# Patient Record
Sex: Female | Born: 1955 | Race: White | Hispanic: No | Marital: Married | Smoking: Never smoker
Health system: Southern US, Community
[De-identification: ages and names within clinical notes are randomized; demographics above are authoritative.]

## PROBLEM LIST (undated history)

## (undated) DIAGNOSIS — M1990 Unspecified osteoarthritis, unspecified site: Secondary | ICD-10-CM

## (undated) DIAGNOSIS — F329 Major depressive disorder, single episode, unspecified: Secondary | ICD-10-CM

## (undated) DIAGNOSIS — F411 Generalized anxiety disorder: Secondary | ICD-10-CM

## (undated) DIAGNOSIS — A6000 Herpesviral infection of urogenital system, unspecified: Secondary | ICD-10-CM

## (undated) DIAGNOSIS — F32A Depression, unspecified: Secondary | ICD-10-CM

## (undated) DIAGNOSIS — J302 Other seasonal allergic rhinitis: Secondary | ICD-10-CM

## (undated) HISTORY — DX: Other seasonal allergic rhinitis: J30.2

## (undated) HISTORY — PX: VAGINAL HYSTERECTOMY: SUR661

## (undated) HISTORY — DX: Unspecified osteoarthritis, unspecified site: M19.90

## (undated) HISTORY — DX: Herpesviral infection of urogenital system, unspecified: A60.00

## (undated) HISTORY — DX: Generalized anxiety disorder: F41.1

## (undated) HISTORY — PX: CERVICAL FUSION: SHX112

---

## 1998-01-11 ENCOUNTER — Other Ambulatory Visit: Admission: RE | Admit: 1998-01-11 | Discharge: 1998-01-11 | Payer: Self-pay | Admitting: Obstetrics and Gynecology

## 2003-05-22 ENCOUNTER — Other Ambulatory Visit: Admission: RE | Admit: 2003-05-22 | Discharge: 2003-05-22 | Payer: Self-pay | Admitting: Gynecology

## 2003-07-19 ENCOUNTER — Ambulatory Visit (HOSPITAL_BASED_OUTPATIENT_CLINIC_OR_DEPARTMENT_OTHER): Admission: RE | Admit: 2003-07-19 | Discharge: 2003-07-19 | Payer: Self-pay | Admitting: Gynecology

## 2004-01-15 ENCOUNTER — Ambulatory Visit (HOSPITAL_COMMUNITY): Admission: RE | Admit: 2004-01-15 | Discharge: 2004-01-15 | Payer: Self-pay | Admitting: Gynecology

## 2005-01-02 ENCOUNTER — Other Ambulatory Visit: Admission: RE | Admit: 2005-01-02 | Discharge: 2005-01-02 | Payer: Self-pay | Admitting: Gynecology

## 2005-04-03 ENCOUNTER — Observation Stay (HOSPITAL_COMMUNITY): Admission: RE | Admit: 2005-04-03 | Discharge: 2005-04-04 | Payer: Self-pay | Admitting: Gynecology

## 2005-04-03 ENCOUNTER — Encounter (INDEPENDENT_AMBULATORY_CARE_PROVIDER_SITE_OTHER): Payer: Self-pay | Admitting: Specialist

## 2006-05-15 ENCOUNTER — Ambulatory Visit: Payer: Self-pay | Admitting: Internal Medicine

## 2006-12-17 ENCOUNTER — Other Ambulatory Visit: Admission: RE | Admit: 2006-12-17 | Discharge: 2006-12-17 | Payer: Self-pay | Admitting: Gynecology

## 2007-06-03 ENCOUNTER — Emergency Department: Payer: Self-pay | Admitting: Emergency Medicine

## 2008-05-01 ENCOUNTER — Encounter: Payer: Self-pay | Admitting: Women's Health

## 2008-05-01 ENCOUNTER — Other Ambulatory Visit: Admission: RE | Admit: 2008-05-01 | Discharge: 2008-05-01 | Payer: Self-pay | Admitting: Gynecology

## 2008-05-01 ENCOUNTER — Ambulatory Visit: Payer: Self-pay | Admitting: Women's Health

## 2008-12-05 ENCOUNTER — Ambulatory Visit: Payer: Self-pay | Admitting: Gynecology

## 2009-05-28 ENCOUNTER — Observation Stay (HOSPITAL_COMMUNITY): Admission: EM | Admit: 2009-05-28 | Discharge: 2009-05-29 | Payer: Self-pay | Admitting: Emergency Medicine

## 2009-05-29 ENCOUNTER — Ambulatory Visit: Payer: Self-pay | Admitting: Surgery

## 2009-05-29 ENCOUNTER — Encounter (INDEPENDENT_AMBULATORY_CARE_PROVIDER_SITE_OTHER): Payer: Self-pay | Admitting: Internal Medicine

## 2010-07-21 ENCOUNTER — Encounter: Payer: Self-pay | Admitting: Gastroenterology

## 2010-09-25 ENCOUNTER — Encounter: Payer: Self-pay | Admitting: Women's Health

## 2010-10-02 LAB — COMPREHENSIVE METABOLIC PANEL
ALT: 47 U/L — ABNORMAL HIGH (ref 0–35)
AST: 36 U/L (ref 0–37)
Albumin: 4.3 g/dL (ref 3.5–5.2)
Alkaline Phosphatase: 76 U/L (ref 39–117)
BUN: 15 mg/dL (ref 6–23)
CO2: 25 mEq/L (ref 19–32)
Calcium: 9 mg/dL (ref 8.4–10.5)
Chloride: 104 mEq/L (ref 96–112)
Creatinine, Ser: 0.68 mg/dL (ref 0.4–1.2)
GFR calc Af Amer: >60 mL/min (ref >60)
GFR calc non Af Amer: >60 mL/min (ref >60)
Glucose, Bld: 92 mg/dL (ref 70–99)
Potassium: 3.5 mEq/L (ref 3.5–5.1)
Sodium: 138 mEq/L (ref 135–145)
Total Bilirubin: 0.7 mg/dL (ref 0.3–1.2)
Total Protein: 7.5 g/dL (ref 6.0–8.3)

## 2010-10-02 LAB — LIPID PANEL
HDL: 73 mg/dL (ref >39)
LDL Cholesterol: 97 mg/dL (ref 0–99)
Total CHOL/HDL Ratio: 2.7 RATIO
Triglycerides: 141 mg/dL (ref <150)

## 2010-10-02 LAB — BASIC METABOLIC PANEL
BUN: 12 mg/dL (ref 6–23)
CO2: 29 mEq/L (ref 19–32)
Chloride: 107 mEq/L (ref 96–112)
Creatinine, Ser: 0.89 mg/dL (ref 0.4–1.2)
GFR calc Af Amer: >60 mL/min (ref >60)
Glucose, Bld: 91 mg/dL (ref 70–99)

## 2010-10-02 LAB — DIFFERENTIAL
Basophils Relative: 0 % (ref 0–1)
Eosinophils Absolute: 0 10*3/uL (ref 0.0–0.7)
Lymphocytes Relative: 27 % (ref 12–46)
Lymphs Abs: 2.1 10*3/uL (ref 0.7–4.0)
Monocytes Absolute: 0.5 10*3/uL (ref 0.1–1.0)
Neutro Abs: 5.3 10*3/uL (ref 1.7–7.7)

## 2010-10-02 LAB — CBC
HCT: 41.1 % (ref 36.0–46.0)
Hemoglobin: 13.9 g/dL (ref 12.0–15.0)
MCHC: 33.9 g/dL (ref 30.0–36.0)
MCHC: 34.1 g/dL (ref 30.0–36.0)
MCV: 94.8 fL (ref 78.0–100.0)
Platelets: 269 10*3/uL (ref 150–400)
RBC: 3.9 MIL/uL (ref 3.87–5.11)
RBC: 4.34 MIL/uL (ref 3.87–5.11)
RDW: 12.7 % (ref 11.5–15.5)
RDW: 12.9 % (ref 11.5–15.5)
WBC: 8 10*3/uL (ref 4.0–10.5)

## 2010-10-02 LAB — PROTIME-INR
INR: 0.94 (ref 0.00–1.49)
Prothrombin Time: 12.5 seconds (ref 11.6–15.2)

## 2010-10-02 LAB — CARDIAC PANEL(CRET KIN+CKTOT+MB+TROPI)
CK, MB: 1.6 ng/mL (ref 0.3–4.0)
CK, MB: 1.7 ng/mL (ref 0.3–4.0)
Relative Index: INVALID (ref 0.0–2.5)
Relative Index: INVALID (ref 0.0–2.5)
Total CK: 58 U/L (ref 7–177)
Total CK: 66 U/L (ref 7–177)
Troponin I: 0.02 ng/mL (ref 0.00–0.06)
Troponin I: 0.02 ng/mL (ref 0.00–0.06)

## 2010-10-02 LAB — POCT CARDIAC MARKERS: Troponin i, poc: <0.05 ng/mL (ref 0.00–0.09)

## 2010-10-11 ENCOUNTER — Other Ambulatory Visit: Payer: Self-pay | Admitting: Women's Health

## 2010-10-11 ENCOUNTER — Encounter (INDEPENDENT_AMBULATORY_CARE_PROVIDER_SITE_OTHER): Payer: BLUE CROSS/BLUE SHIELD | Admitting: Women's Health

## 2010-10-11 ENCOUNTER — Other Ambulatory Visit (HOSPITAL_COMMUNITY)
Admission: RE | Admit: 2010-10-11 | Discharge: 2010-10-11 | Disposition: A | Discharge status: Home / Self Care | Payer: BLUE CROSS/BLUE SHIELD | Source: Ambulatory Visit | Attending: Gynecology | Admitting: Gynecology

## 2010-10-11 DIAGNOSIS — Z124 Encounter for screening for malignant neoplasm of cervix: Secondary | ICD-10-CM | POA: Insufficient documentation

## 2010-10-11 DIAGNOSIS — Z13220 Encounter for screening for lipoid disorders: Secondary | ICD-10-CM

## 2010-10-11 DIAGNOSIS — Z01419 Encounter for gynecological examination (general) (routine) without abnormal findings: Secondary | ICD-10-CM

## 2010-10-11 DIAGNOSIS — Z833 Family history of diabetes mellitus: Secondary | ICD-10-CM

## 2010-10-11 DIAGNOSIS — E079 Disorder of thyroid, unspecified: Secondary | ICD-10-CM

## 2010-10-11 DIAGNOSIS — R82998 Other abnormal findings in urine: Secondary | ICD-10-CM

## 2010-11-15 NOTE — Op Note (Signed)
Christina [name], Christina [name]                         ACCOUNT NO.:  [id number]   MEDICAL RECORD NO.:  [id number]                   PATIENT TYPE:  AMB   LOCATION:  [facility]                                 FACILITY:  [facility]   PHYSICIAN:  Timothy P. Fontaine, M.D.           DATE OF BIRTH:  09-25-1955   DATE OF PROCEDURE:  07/19/2003  DATE OF DISCHARGE:                                 OPERATIVE REPORT   PREOPERATIVE DIAGNOSIS:  Menorrhagia.   POSTOPERATIVE DIAGNOSIS:  Menorrhagia.   PROCEDURE:  HTA, endometrial ablation.   SURGEON:  Timothy P. Fontaine, M.D.   ANESTHESIA:  General.   ESTIMATED BLOOD LOSS:  Minimal.   COMPLICATIONS:  None.   SPECIMENS:  None.   FINDINGS:  Endometrial cavity normal; mild arcuate appearance.  Inspection  adequate, noting right and left ostia.  Fundus, anterior/posterior surfaces,  lower uterine segment and the cervical canal all visualized.   DESCRIPTION OF PROCEDURE:  The patient was taken to the operating room and  underwent general anesthesia.  She was placed in the low dorsal lithotomy  position.  Received a perineal vaginal preparation of Betadine solution.  Bladder emptied with in/out Foley catheterization.  EUA performed and the  patient was draped in usual fashion.   The cervix was visualized and weighted speculum placed.  The anterior lip  grasped with a single-toothed tenaculum and the cervix was gently and  gradually dilated to admit the HTA hysteroscope.  Hysteroscopy was  performed, which was noted to be adequate.  The HTA ablation was then  performed according to the manufacturer's sequence without difficulty, and  with no leakage of fluid.  Appropriate uniform blanching of the endometrium  noted.  At the termination of the procedure the instruments were removed.  Adequate hemostasis visualized.  The patient was placed in the supine  position and awakened without difficulty.  She was taken to the recovery  room in good condition, having  tolerated the procedure well.                                               Timothy P. Audie Box, M.D.    TPF/MEDQ  D:  07/19/2003  T:  07/19/2003  Job:  [id number]

## 2010-11-15 NOTE — H&P (Signed)
Christina [name], Christina [name]                  ACCOUNT NO.:  [id number]   MEDICAL RECORD NO.:  [id number]          PATIENT TYPE:  AMB   LOCATION:  [facility]                           FACILITY:  [facility]   PHYSICIAN:  Timothy P. Fontaine, M.D.DATE OF BIRTH:  1955/09/27   DATE OF ADMISSION:  DATE OF DISCHARGE:                                HISTORY & PHYSICAL   CHIEF COMPLAINT:  Cervical dysplasia, menorrhagia.   HISTORY OF PRESENT ILLNESS:  This 55 year old G60, P4 female with history of  low grade dysplasia, recent CMB with negative ECC, who has a history of  endometrial ablation in January 2005 initially had a good response without  significant bleeding, but subsequently over the past year, has returned with  regular menses that are progressively getting heavier and heavier is  admitted for total vaginal hysterectomy.  Risks, benefits, indications, and  alternatives for the procedure were discussed and the patient wants to  proceed with hysterectomy.   PAST MEDICAL HISTORY:  Uncomplicated.   PAST SURGICAL HISTORY:  1.  Cesarean section x2.  Subsequent vaginal delivery.  2.  Laparoscopic tubal sterilization.  3.  Breast biopsy.   CURRENT MEDICATIONS:  None.   ALLERGIES:  None.   REVIEW OF SYSTEMS:  Noncontributory.   SOCIAL HISTORY:  Noncontributory.   PHYSICAL EXAMINATION:  VITAL SIGNS:  Afebrile.  Vital signs are stable.  HEENT:  Normal.  LUNGS:  Clear.  CARDIAC:  Regular rate without rubs, murmurs, or gallops.  ABDOMEN:  Exam benign.  PELVIC:  External BUS, vagina normal.  Cervix normal.  Uterus normal size,  midline, and mobile.  Adnexa without masses or tenderness.   ASSESSMENT:  A 55 year old G44, P4 female, status post cesarean section x2,  subsequent vaginal delivery, subsequent tubal sterilization without pelvic  pathology noted such as adhesive disease, history of menorrhagia, status  post endometrial ablation, with return of menses now heavier, becoming  unacceptable to the  patient.  She also has recent low grade SIL Pap smear,  status post CMB with a negative ECC.  Risks, benefits, indications, and  alternatives for the procedure were reviewed with the patient to include  expectant management, reablation, surgical intervention, and the patient  elects for vaginal hysterectomy.  I reviewed with the patient and her  husband the issues with hysterectomy.  I discussed the absolute sterility  associated with hysterectomy.  I reviewed the ovarian conservation issue  with her and the issues of keeping or removing both ovaries were discussed.  The patient understands by keeping both ovaries, she is at risk for future  ovarian disease, both benign requiring future operation, as well as  malignant and ovarian cancer in the future and she understands and accepts  this.  By removing her ovaries she understands the removal of hormone  production and the potential for acute onset of menopausal symptoms.  The  patient elects to keep both ovaries.  She would prefer to go through  menopause naturally.  She understands the average age of menopause is 9,  that she will be at risk for ovarian disease and cancer the  rest of her life  and she understands and accepts this.  The patient does give me permission  to remove one or both ovaries if intraoperatively I find significant disease  for my best judgment that they should be removed and she does give me  permission to do so, but does want to keep both ovaries if possible.  The  patient also understands that anytime during the procedure I may convert the  vaginal hysterectomy to an abdominal approach if I feel it is unsafe to  proceed with the vaginal approach or if complications occur during the  procedure.  She has had multiple prior surgeries to include two cesarean  sections and she understands that there is potential for scarring, although  she has had a follow-up laparoscopic tubal where no intraperitoneal disease  was  noted.  Sexuality following hysterectomy was also discussed and the  potential for persistent dyspareunia as well as orgasmic dysfunction was  discussed, understood, and accepted.  The acute intraoperative risks and  postoperative risks were reviewed to include the risk of infection, both  internal, requiring prolonged antibiotics, as well as internal abscess or  hematoma formation, such as cuff abscess, cuff hematoma requiring  reoperation and drainage, as well as wound complications if indeed an  abdominal incision is made and the need to open and drain this incision and  close by secondary intention was all discussed, understood, and accepted.  The risks of hemorrhage necessitating transfusion and the risks of  transfusion were discussed to include transfusion reaction, hepatitis, HIV,  mad cow disease, and other unknown entities.  The risks of inadvertent  injury to internal organs including bowel, bladder, ureters, vessels, and  nerves, necessitating major exploratory reparative surgeries and future  reparative surgeries including ostomy formation, was all reviewed,  understood, and accepted.  The patient understands given her prior history  of cesarean sections that there is increased risks of scarring and  realistically an increased risk for damage to the bladder and she  understands and accepts this.  The patient also understands that she has  cervical dysplasia, that although removing her cervix will remove this risk,  she still is at risk for vaginal or vulvar dysplasia in the future and still  needs to be monitored for this.  The patient's questions were answered to  her satisfaction and she is ready to proceed with surgery.      Timothy P. Fontaine, M.D.  Electronically Signed     TPF/MEDQ  D:  03/24/2005  T:  03/24/2005  Job:  [id number]

## 2010-11-15 NOTE — Op Note (Signed)
NAMEJoaquim [name]              ACCOUNT NO.:  [id number]   MEDICAL RECORD NO.:  [id number]          PATIENT TYPE:  OBV   LOCATION:  9399                          FACILITY:  [facility]   PHYSICIAN:  Timothy P. Fontaine, M.D.DATE OF BIRTH:  January 25, 1956   DATE OF PROCEDURE:  04/03/2005  DATE OF DISCHARGE:                                 OPERATIVE REPORT   PREOPERATIVE DIAGNOSES:  Menorrhagia, history of dysplasia.   POSTOPERATIVE DIAGNOSES:  Menorrhagia, history of dysplasia.   PROCEDURE:  Total vaginal hysterectomy.   SURGEON:  Timothy P. Fontaine, M.D.   ASSISTANT:  Rande Brunt. Eda Paschal, M.D.   ANESTHETIC:  General endotracheal.   COMPLICATIONS:  None.   ESTIMATED BLOOD LOSS:  50 mL.   SPECIMEN:  Uterus.   FINDINGS:  Small subserosal leiomyomata. The uterus globoid shape, grossly  normal in size. The right and left ovaries grossly normal. Right and left  tubal segments grossly normal.   PROCEDURE:  The patient was taken to the operating room, underwent general  anesthesia and was placed dorsal lithotomy position. Received a vaginal  perineal preparation with Betadine solution. Bladder emptied with in-and-out  Foley catheterization. EUA performed and the patient was draped in usual  fashion. The cervix was visualized with a surgical speculum. Anterior lip  grasped with single-tooth tenaculum and the cervix was circumferentially  injected submucosally with lidocaine epinephrine mixture. Approximately 8 mL  total used. The cervical mucosa was then sharply incised circumferentially  and the paracervical planes were sharply developed. The posterior cul-de-sac  was then entered sharply without difficulty. A long weighted speculum was  placed and the right and left uterosacral ligaments were identified,  clamped, cut and ligated using 0 Vicryl suture and tagged for future  reference. The anterior cul-de-sac was then sharply entered without  difficulty and the uterus was then  progressively freed from its attachments  through clamping, cutting and ligating of the cardinal ligaments,  parametrial tissues using 0 Vicryl suture. Uterus was then delivered through  the vagina and the uterine ovarian pedicles were clamped bilaterally and cut  freeing the specimen which was sent to pathology. The uterine ovarian  pedicles were then secured bilaterally using 0 Vicryl suture in a simple  stitch followed by suture ligatures. All pedicles showed adequate  hemostasis. The long weighted speculum was replaced with a short weighted  speculum and the posterior vaginal cuff was then run from uterosacral  ligament to uterosacral ligament using 0 Vicryl suture in a running  interlocking stitch. The pelvis and vagina was copiously irrigated with  irrigation showing adequate hemostasis. The tail sponge was removed and the  vagina was closed anterior to posterior using 0 Vicryl suture in interrupted  figure-of-eight stitch. The vagina was  irrigated. Adequate hemostasis was visualized. The Foley catheter placed  with free-flowing clear yellow urine noted. The patient placed in supine  position, awakened without difficulty and taken to recovery room in good  condition having tolerated the procedure well.      Timothy P. Fontaine, M.D.  Electronically Signed     TPF/MEDQ  D:  04/03/2005  T:  04/03/2005  Job:  [id number]

## 2010-11-15 NOTE — H&P (Signed)
Christina [name], Christina [name]                         ACCOUNT NO.:  [id number]   MEDICAL RECORD NO.:  [id number]                   PATIENT TYPE:  AMB   LOCATION:  [facility]                                 FACILITY:  [facility]   PHYSICIAN:  Timothy P. Fontaine, M.D.           DATE OF BIRTH:  Jul 15, 1955   DATE OF ADMISSION:  07/19/2003  DATE OF DISCHARGE:                                HISTORY & PHYSICAL   Being admitted to [facility], on July 19, 2003 at  8:30 for surgery.   CHIEF COMPLAINT:  Menorrhagia, dysmenorrhea.   HISTORY OF PRESENT ILLNESS:  A 55 year old, G28 P4, female with a history of  worsening menorrhagia and dysmenorrhea which has now become socially  unacceptable to her.  Her preoperative evaluation includes a normal TSH, a  negative endometrial biopsy, and an ultrasound which shows a normal  endometrial cavity, and normal ovaries bilaterally.  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  The patient was counseled as to the options for  management of her menorrhagia, dysmenorrhea, and she elects for HTA  endometrial ablation.   PAST MEDICAL HISTORY:  Depression.   PAST SURGICAL HISTORY:  Includes:  1. Cesarean section x 2.  2. Laparoscopic tubal sterilization.  3. Breast biopsy.   CURRENT MEDICATIONS:  Zoloft 50 mg daily.   REVIEW OF SYSTEMS:  Noncontributory.   SOCIAL HISTORY:  Noncontributory.   ADMISSION PHYSICAL EXAM:  VITAL SIGNS:  Afebrile.  Vital signs are stable.  HEENT:  Normal.  LUNGS:  Clear.  CARDIAC:  Regular rate without rubs, murmurs or gallops.  ABDOMEN:  Benign.  PELVIC:  External BUS, vagina normal.  Cervix normal.  Uterus normal size,  anteverted.  Adnexa without masses or tenderness.   ASSESSMENT:  A 55 year old, gravida 4 para 4, female, worsening menorrhagia,  dysmenorrhea for endometrial ablation.   She has been on Depo-Lupron  x 1 month, has a negative endometrial biopsy  and a normal sonohistogram.  I reviewed with the  patient the expected  intraoperative, postoperative courses and the immediate and delayed risks of  the procedure.  I reviewed with her, there are various methodologies to do  endometrial ablation, and she understands that we will use the HTA  technique.  The patient understands there are no guarantees as far as the  procedure, that we expect that her menses will be lighter if not absent, but  she understands that she may be a failure and that her periods may continue  to be heavy and painful.  She also understands that with ablation absolute  irreversible sterility should be in place.  She has had a tubal  sterilization in the past, but she also understands that there is no  consideration to have this reversed after the ablation.  I also reviewed  with her the long term issues as far as following patients with ablation,  the issues of hematometra as well as possible  endometrial cancer in the  future.  She understands and accepts all of these issues.  The expected  intraoperative course was reviewed and the risks to include bleeding,  transfusion, infection, uterine perforation, damage to internal organs  including bowel, bladder, ureters, vessels and nerves necessitating major  exploratory reparative surgeries and future reparative surgeries including  ostomy formation.  The risk of thermal injury, with the fluid, can include  vaginal vulvar burns was also discussed with her.  She understands if  perforation occurs or if there is a failure with the machinery, that we may  need to abort the case and reschedule her a future date, and she understands  and accepts this.  The patient's questions were answered to her  satisfaction, and she is ready to proceed with surgery.                                               Timothy P. Audie Box, M.D.    TPF/MEDQ  D:  07/14/2003  T:  07/14/2003  Job:  [id number]

## 2010-11-15 NOTE — Discharge Summary (Signed)
NAMEJoaquim [name]              ACCOUNT NO.:  [id number]   MEDICAL RECORD NO.:  [id number]          PATIENT TYPE:  OBV   LOCATION:  9313                          FACILITY:  [facility]   PHYSICIAN:  Timothy P. Fontaine, M.D.DATE OF BIRTH:  [birth year]   DATE OF ADMISSION:  04/03/2005  DATE OF DISCHARGE:                                 DISCHARGE SUMMARY   DISCHARGE DIAGNOSES:  1.  Menorrhagia.  2.  Dysplasia.   PROCEDURE:  Total vaginal hysterectomy, April 03, 2005; pathology pending.   HOSPITAL COURSE:  A 55 year old G27 P4 female, history of menorrhagia, failed  endometrial ablation, recent cervical dysplasia, for total vaginal  hysterectomy. The patient underwent an uncomplicated total vaginal  hysterectomy April 03, 2005, and her postoperative course was uncomplicated  with a postoperative hemoglobin 11.7. The patient was ambulating well,  tolerating a regular diet at the time of discharge. She received discharge  instructions, precautions and follow-up, will be seen in the office in 2  weeks, with prescriptions for Motrin 800 mg #30 one p.o. q.8h. p.r.n. pain  and Tylox #20 one to two p.o. q.4-6h. p.r.n. pain.      Timothy P. Fontaine, M.D.  Electronically Signed     TPF/MEDQ  D:  04/04/2005  T:  04/04/2005  Job:  [id number]

## 2013-01-24 ENCOUNTER — Encounter: Payer: Self-pay | Admitting: Women's Health

## 2013-03-14 ENCOUNTER — Other Ambulatory Visit: Payer: Self-pay | Admitting: General Practice

## 2013-03-14 DIAGNOSIS — M542 Cervicalgia: Secondary | ICD-10-CM

## 2013-03-15 ENCOUNTER — Ambulatory Visit
Admission: RE | Admit: 2013-03-15 | Discharge: 2013-03-15 | Disposition: A | Discharge status: Home / Self Care | Payer: BLUE CROSS/BLUE SHIELD | Source: Ambulatory Visit | Attending: General Practice | Admitting: General Practice

## 2013-03-15 DIAGNOSIS — M542 Cervicalgia: Secondary | ICD-10-CM

## 2013-03-25 ENCOUNTER — Other Ambulatory Visit (HOSPITAL_COMMUNITY)
Admission: RE | Admit: 2013-03-25 | Discharge: 2013-03-25 | Disposition: A | Discharge status: Home / Self Care | Payer: BC Managed Care – PPO | Source: Ambulatory Visit | Attending: Gynecology | Admitting: Gynecology

## 2013-03-25 ENCOUNTER — Encounter: Payer: Self-pay | Admitting: Women's Health

## 2013-03-25 ENCOUNTER — Ambulatory Visit (INDEPENDENT_AMBULATORY_CARE_PROVIDER_SITE_OTHER): Payer: BC Managed Care – PPO | Admitting: Women's Health

## 2013-03-25 VITALS — BP 140/78 | Ht 64.5 in | Wt 170.0 lb

## 2013-03-25 DIAGNOSIS — Z780 Asymptomatic menopausal state: Secondary | ICD-10-CM

## 2013-03-25 DIAGNOSIS — M549 Dorsalgia, unspecified: Secondary | ICD-10-CM

## 2013-03-25 DIAGNOSIS — Z833 Family history of diabetes mellitus: Secondary | ICD-10-CM

## 2013-03-25 DIAGNOSIS — G8929 Other chronic pain: Secondary | ICD-10-CM | POA: Insufficient documentation

## 2013-03-25 DIAGNOSIS — Z01419 Encounter for gynecological examination (general) (routine) without abnormal findings: Secondary | ICD-10-CM | POA: Insufficient documentation

## 2013-03-25 DIAGNOSIS — E079 Disorder of thyroid, unspecified: Secondary | ICD-10-CM

## 2013-03-25 DIAGNOSIS — Z13220 Encounter for screening for lipoid disorders: Secondary | ICD-10-CM

## 2013-03-25 LAB — CBC WITH DIFFERENTIAL/PLATELET
Basophils Absolute: 0 10*3/uL (ref 0.0–0.1)
Hemoglobin: 16.4 g/dL — ABNORMAL HIGH (ref 12.0–15.0)
Lymphs Abs: 3 10*3/uL (ref 0.7–4.0)
Monocytes Absolute: 1 10*3/uL (ref 0.1–1.0)
Neutro Abs: 7.3 10*3/uL (ref 1.7–7.7)
Neutrophils Relative %: 64 % (ref 43–77)
Platelets: 368 10*3/uL (ref 150–400)
RBC: 5.06 MIL/uL (ref 3.87–5.11)

## 2013-03-25 LAB — GLUCOSE, RANDOM: Glucose, Bld: 88 mg/dL (ref 70–99)

## 2013-03-25 LAB — LIPID PANEL
LDL Cholesterol: 79 mg/dL (ref 0–99)
Total CHOL/HDL Ratio: 2.6 Ratio

## 2013-03-25 NOTE — Progress Notes (Signed)
Christina [name] 03/11/1956 [id number]    History:    The patient presents for annual exam with no complaints. No HRT/Hysterectomy 2006 for DUB/mild cervical dysplasia. Novosure Ablation 2005 with no relief of menorrhagia.  Normal Pap history post hysterectomy.  Left breast biopsy 04/2008, fibroadenoma, normal mammogram history since. Father had malignant colon polyps, normal colonoscopy age 56. Chronic back pain using a fentanyl patch from orthopedist.  Past medical history, past surgical history, family history and social history were all reviewed and documented in the EPIC chart. Mother: Brain cancer, died in her 72s. Father: Prostate cancer. Maternal grandmother: Breast cancer. Maternal aunt: Ovarian and breast cancer, BRCA status unknown. 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 Twin daughters(Courtney and Gunnar Fusi) and 2 sons(Jamie and Scott), doing well. Daughters husband bilateral amputee doing well has returned from [place], have 69-month-old son, doing well.   ROS:  A  ROS was performed and pertinent positives and negatives are included in the history.  Exam:  Filed Vitals:   03/25/13 1406  BP: 140/78    General appearance:  Normal Head/Neck:  Normal, without cervical or supraclavicular adenopathy. Thyroid:  Symmetrical, normal in size, without palpable masses or nodularity. Respiratory  Effort:  Normal  Auscultation:  Clear without wheezing or rhonchi Cardiovascular  Auscultation:  Regular rate, without rubs, murmurs or gallops  Edema/varicosities:  Not grossly evident Abdominal  Soft,nontender, without masses, guarding or rebound.  Liver/spleen:  No organomegaly noted  Hernia:  None appreciated  Skin  Inspection:  Grossly normal  Palpation:  Grossly normal Neurologic/psychiatric  Orientation:  Normal with appropriate conversation.  Mood/affect:  Normal  Genitourinary    Breasts: Examined lying and sitting.     Right: Without masses, retractions, discharge or  axillary adenopathy.     Left: Without masses, retractions, discharge or axillary adenopathy.   Inguinal/mons:  Normal without inguinal adenopathy  External genitalia:  Normal  BUS/Urethra/Skene's glands:  Normal  Bladder:  Normal  Vagina:  Normal  Cervix:  Absent  Uterus:  Absent  Adnexa/parametria:     Rt: Without masses or tenderness.   Lt: Without masses or tenderness.  Anus and perineum: Normal  Digital rectal exam: Normal sphincter tone without palpated masses or tenderness  Assessment/Plan:  57 y.o. G4P4 MWF for annual exam.    Vaginal hysterectomy 2006 for DUB/mild cervical dysplasia /no HRT Influenza vaccine Borderline blood pressure  Plan: Vaginal Pap, Pap normal 2012, new screening guidelines reviewed, UA, CBC, lipid panel, glucose, TSH. Annual skin checks and screening colonoscopy encouraged. Continue annual mammograms, SBE's, MVI, vitamin D 2000 daily/calcium rich diet, regular exercise encouraged. Instructed to recheck blood pressure away from office if continues greater than 130/80 followup with primary care. DEXA, will schedule reviewed importance of regular exercise.  BRCA testing reviewed, due to family history of MA with breast and ovarian cancer MGM with breast cancer, mother had brain cancer. F prostate and colon cancer.    Harrington Challenger WHNP, 2:50 PM 03/25/2013

## 2013-03-25 NOTE — Patient Instructions (Addendum)
Health Recommendations for Postmenopausal Women Respected and ongoing research has looked at the most common causes of death, disability, and poor quality of life in postmenopausal women. The causes include heart disease, diseases of blood vessels, diabetes, depression, cancer, and bone loss (osteoporosis). Many things can be done to help lower the chances of developing these and other common problems: CARDIOVASCULAR DISEASE Heart Disease: A heart attack is a medical emergency. Know the signs and symptoms of a heart attack. Below are things women can do to reduce their risk for heart disease.   Do not smoke. If you smoke, quit.  Aim for a healthy weight. Being overweight causes many preventable deaths. Eat a healthy and balanced diet and drink an adequate amount of liquids.  Get moving. Make a commitment to be more physically active. Aim for 30 minutes of activity on most, if not all days of the week.  Eat for heart health. Choose a diet that is low in saturated fat and cholesterol and eliminate trans fat. Include whole grains, vegetables, and fruits. Read and understand the labels on food containers before buying.  Know your numbers. Ask your caregiver to check your blood pressure, cholesterol (total, HDL, LDL, triglycerides) and blood glucose. Work with your caregiver on improving your entire clinical picture.  High blood pressure. Limit or stop your table salt intake (try salt substitute and food seasonings). Avoid salty foods and drinks. Read labels on food containers before buying. Eating well and exercising can help control high blood pressure. STROKE  Stroke is a medical emergency. Stroke may be the result of a blood clot in a blood vessel in the brain or by a brain hemorrhage (bleeding). Know the signs and symptoms of a stroke. To lower the risk of developing a stroke:  Avoid fatty foods.  Quit smoking.  Control your diabetes, blood pressure, and irregular heart rate. THROMBOPHLEBITIS  (BLOOD CLOT) OF THE LEG  Becoming overweight and leading a stationary lifestyle may also contribute to developing blood clots. Controlling your diet and exercising will help lower the risk of developing blood clots. CANCER SCREENING  Breast Cancer: Take steps to reduce your risk of breast cancer.  You should practice "breast self-awareness." This means understanding the normal appearance and feel of your breasts and should include breast self-examination. Any changes detected, no matter how small, should be reported to your caregiver.  After age 40, you should have a clinical breast exam (CBE) every year.  Starting at age 40, you should consider having a mammogram (breast X-ray) every year.  If you have a family history of breast cancer, talk to your caregiver about genetic screening.  If you are at high risk for breast cancer, talk to your caregiver about having an MRI and a mammogram every year.  Intestinal or Stomach Cancer: Tests to consider are a rectal exam, fecal occult blood, sigmoidoscopy, and colonoscopy. Women who are high risk may need to be screened at an earlier age and more often.  Cervical Cancer:  Beginning at age 30, you should have a Pap test every 3 years as long as the past 3 Pap tests have been normal.  If you have had past treatment for cervical cancer or a condition that could lead to cancer, you need Pap tests and screening for cancer for at least 20 years after your treatment.  If you had a hysterectomy for a problem that was not cancer or a condition that could lead to cancer, then you no longer need Pap tests.    If you are between ages 65 and 70, and you have had normal Pap tests going back 10 years, you no longer need Pap tests.  If Pap tests have been discontinued, risk factors (such as a new sexual partner) need to be reassessed to determine if screening should be resumed.  Some medical problems can increase the chance of getting cervical cancer. In these  cases, your caregiver may recommend more frequent screening and Pap tests.  Uterine Cancer: If you have vaginal bleeding after reaching menopause, you should notify your caregiver.  Ovarian cancer: Other than yearly pelvic exams, there are no reliable tests available to screen for ovarian cancer at this time except for yearly pelvic exams.  Lung Cancer: Yearly chest X-rays can detect lung cancer and should be done on high risk women, such as cigarette smokers and women with chronic lung disease (emphysema).  Skin Cancer: A complete body skin exam should be done at your yearly examination. Avoid overexposure to the sun and ultraviolet light lamps. Use a strong sun block cream when in the sun. All of these things are important in lowering the risk of skin cancer. MENOPAUSE Menopause Symptoms: Hormone therapy products are effective for treating symptoms associated with menopause:  Moderate to severe hot flashes.  Night sweats.  Mood swings.  Headaches.  Tiredness.  Loss of sex drive.  Insomnia.  Other symptoms. Hormone replacement carries certain risks, especially in older women. Women who use or are thinking about using estrogen or estrogen with progestin treatments should discuss that with their caregiver. Your caregiver will help you understand the benefits and risks. The ideal dose of hormone replacement therapy is not known. The Food and Drug Administration (FDA) has concluded that hormone therapy should be used only at the lowest doses and for the shortest amount of time to reach treatment goals.  OSTEOPOROSIS Protecting Against Bone Loss and Preventing Fracture: If you use hormone therapy for prevention of bone loss (osteoporosis), the risks for bone loss must outweigh the risk of the therapy. Ask your caregiver about other medications known to be safe and effective for preventing bone loss and fractures. To guard against bone loss or fractures, the following is recommended:  If  you are less than age 50, take 1000 mg of calcium and at least 600 mg of Vitamin D per day.  If you are greater than age 50 but less than age 70, take 1200 mg of calcium and at least 600 mg of Vitamin D per day.  If you are greater than age 70, take 1200 mg of calcium and at least 800 mg of Vitamin D per day. Smoking and excessive alcohol intake increases the risk of osteoporosis. Eat foods rich in calcium and vitamin D and do weight bearing exercises several times a week as your caregiver suggests. DIABETES Diabetes Melitus: If you have Type I or Type 2 diabetes, you should keep your blood sugar under control with diet, exercise and recommended medication. Avoid too many sweets, starchy and fatty foods. Being overweight can make control more difficult. COGNITION AND MEMORY Cognition and Memory: Menopausal hormone therapy is not recommended for the prevention of cognitive disorders such as Alzheimer's disease or memory loss.  DEPRESSION  Depression may occur at any age, but is common in elderly women. The reasons may be because of physical, medical, social (loneliness), or financial problems and needs. If you are experiencing depression because of medical problems and control of symptoms, talk to your caregiver about this. Physical activity and   exercise may help with mood and sleep. Community and volunteer involvement may help your sense of value and worth. If you have depression and you feel that the problem is getting worse or becoming severe, talk to your caregiver about treatment options that are best for you. ACCIDENTS  Accidents are common and can be serious in the elderly woman. Prepare your house to prevent accidents. Eliminate throw rugs, place hand bars in the bath, shower and toilet areas. Avoid wearing high heeled shoes or walking on wet, snowy, and icy areas. Limit or stop driving if you have vision or hearing problems, or you feel you are unsteady with you movements and  reflexes. HEPATITIS C Hepatitis C is a type of viral infection affecting the liver. It is spread mainly through contact with blood from an infected person. It can be treated, but if left untreated, it can lead to severe liver damage over years. Many people who are infected do not know that the virus is in their blood. If you are a "baby-boomer", it is recommended that you have one screening test for Hepatitis C. IMMUNIZATIONS  Several immunizations are important to consider having during your senior years, including:   Tetanus, diptheria, and pertussis booster shot.  Influenza every year before the flu season begins.  Pneumonia vaccine.  Shingles vaccine.  Others as indicated based on your specific needs. Talk to your caregiver about these. Document Released: 08/08/2005 Document Revised: 06/02/2012 Document Reviewed: 04/03/2008 [employer] Patient Information 2014 [employer].  

## 2013-03-26 LAB — URINALYSIS W MICROSCOPIC + REFLEX CULTURE
Bilirubin Urine: NEGATIVE
Casts: NONE SEEN
Glucose, UA: NEGATIVE mg/dL
Hgb urine dipstick: NEGATIVE
Ketones, ur: NEGATIVE mg/dL
Nitrite: NEGATIVE
Specific Gravity, Urine: 1.017 (ref 1.005–1.030)
Urobilinogen, UA: 0.2 mg/dL (ref 0.0–1.0)
pH: 5 (ref 5.0–8.0)

## 2013-03-26 LAB — TSH: TSH: 1.764 u[IU]/mL (ref 0.350–4.500)

## 2013-03-27 LAB — URINE CULTURE

## 2013-03-31 ENCOUNTER — Encounter: Payer: Self-pay | Admitting: Women's Health

## 2013-04-05 ENCOUNTER — Encounter: Payer: Self-pay | Admitting: Women's Health

## 2013-05-21 HISTORY — PX: CARPAL TUNNEL RELEASE: SHX101

## 2013-09-27 ENCOUNTER — Encounter: Payer: Self-pay | Admitting: Women's Health

## 2013-09-27 ENCOUNTER — Ambulatory Visit (INDEPENDENT_AMBULATORY_CARE_PROVIDER_SITE_OTHER): Payer: BC Managed Care – PPO | Admitting: Women's Health

## 2013-09-27 VITALS — BP 140/100

## 2013-09-27 DIAGNOSIS — N952 Postmenopausal atrophic vaginitis: Secondary | ICD-10-CM

## 2013-09-27 DIAGNOSIS — F411 Generalized anxiety disorder: Secondary | ICD-10-CM

## 2013-09-27 MED ORDER — ESTRADIOL 10 MCG VA TABS: 1.0000 | ORAL_TABLET | VAGINAL | Status: DC

## 2013-09-27 MED ORDER — VENLAFAXINE HCL ER 75 MG PO CP24: ORAL_CAPSULE | ORAL | Status: DC

## 2013-09-27 NOTE — Patient Instructions (Signed)
Venlafaxine extended-release capsules What is this medicine? VENLAFAXINE(VEN la fax een) is used to treat depression, anxiety and panic disorder. This medicine may be used for other purposes; ask your health care provider or pharmacist if you have questions. COMMON BRAND NAME(S): Effexor XR What should I tell my health care provider before I take this medicine? They need to know if you have any of these conditions: -bleeding disorders -glaucoma -heart disease -high blood pressure -high cholesterol -kidney disease -liver disease -low levels of sodium in the blood -mania or bipolar disorder -seizures -suicidal thoughts, plans, or attempt; a previous suicide attempt by you or a family -take medicines that treat or prevent blood clots -thyroid disease -an unusual or allergic reaction to venlafaxine, desvenlafaxine, other medicines, foods, dyes, or preservatives -pregnant or trying to get pregnant -breast-feeding How should I use this medicine? Take this medicine by mouth with a full glass of water. Follow the directions on the prescription label. Do not cut, crush, or chew this medicine. Take it with food. If needed, the capsule may be carefully opened and the entire contents sprinkled on a spoonful of cool applesauce. Swallow the applesauce/pellet mixture right away without chewing and follow with a glass of water to ensure complete swallowing of the pellets. Try to take your medicine at about the same time each day. Do not take your medicine more often than directed. Do not stop taking this medicine suddenly except upon the advice of your doctor. Stopping this medicine too quickly may cause serious side effects or your condition may worsen. A special MedGuide will be given to you by the pharmacist with each prescription and refill. Be sure to read this information carefully each time. Talk to your pediatrician regarding the use of this medicine in children. Special care may be  needed. Overdosage: If you think you have taken too much of this medicine contact a poison control center or emergency room at once. NOTE: This medicine is only for you. Do not share this medicine with others. What if I miss a dose? If you miss a dose, take it as soon as you can. If it is almost time for your next dose, take only that dose. Do not take double or extra doses. What may interact with this medicine? Do not take this medicine with any of the following medications: -certain medicines for fungal infections like fluconazole, itraconazole, ketoconazole, posaconazole, voriconazole -cisapride -desvenlafaxine -dofetilide -dronedarone -duloxetine -levomilnacipran -linezolid -MAOIs like Carbex, Eldepryl, Marplan, Nardil, and Parnate -methylene blue (injected into a vein) -milnacipran -pimozide -thioridazine -ziprasidone  This medicine may also interact with the following medications: -aspirin and aspirin-like medicines -certain medicines for depression, anxiety, or psychotic disturbances -certain medicines for migraine headaches like almotriptan, eletriptan, frovatriptan, naratriptan, rizatriptan, sumatriptan, zolmitriptan -certain medicines for sleep -certain medicines that treat or prevent blood clots like dalteparin, enoxaparin, warfarin -cimetidine -clozapine -diuretics -fentanyl -furazolidone -indinavir -isoniazid -lithium -metoprolol -NSAIDS, medicines for pain and inflammation, like ibuprofen or naproxen -other medicines that prolong the QT interval (cause an abnormal heart rhythm) -procarbazine -rasagiline -supplements like St. John's wort, kava kava, valerian -tramadol -tryptophan This list may not describe all possible interactions. Give your health care provider a list of all the medicines, herbs, non-prescription drugs, or dietary supplements you use. Also tell them if you smoke, drink alcohol, or use illegal drugs. Some items may interact with your  medicine. What should I watch for while using this medicine? Tell your doctor if your symptoms do not get better or if they get   worse. Visit your doctor or health care professional for regular checks on your progress. Because it may take several weeks to see the full effects of this medicine, it is important to continue your treatment as prescribed by your doctor. Patients and their families should watch out for new or worsening thoughts of suicide or depression. Also watch out for sudden changes in feelings such as feeling anxious, agitated, panicky, irritable, hostile, aggressive, impulsive, severely restless, overly excited and hyperactive, or not being able to sleep. If this happens, especially at the beginning of treatment or after a change in dose, call your health care professional. This medicine can cause an increase in blood pressure. Check with your doctor for instructions on monitoring your blood pressure while taking this medicine. You may get drowsy or dizzy. Do not drive, use machinery, or do anything that needs mental alertness until you know how this medicine affects you. Do not stand or sit up quickly, especially if you are an older patient. This reduces the risk of dizzy or fainting spells. Alcohol may interfere with the effect of this medicine. Avoid alcoholic drinks. Your mouth may get dry. Chewing sugarless gum, sucking hard candy and drinking plenty of water will help. Contact your doctor if the problem does not go away or is severe. What side effects may I notice from receiving this medicine? Side effects that you should report to your doctor or health care professional as soon as possible: -allergic reactions like skin rash, itching or hives, swelling of the face, lips, or tongue -breathing problems -changes in vision -hallucination, loss of contact with reality -seizures -suicidal thoughts or other mood changes -trouble passing urine or change in the amount of urine -unusual  bleeding or bruising Side effects that usually do not require medical attention (report to your doctor or health care professional if they continue or are bothersome): -change in sex drive or performance -constipation -increased sweating -loss of appetite -nausea -tremors -weight loss This list may not describe all possible side effects. Call your doctor for medical advice about side effects. You may report side effects to FDA at 1-800-FDA-1088. Where should I keep my medicine? Keep out of the reach of children. Store at a controlled temperature between 20 and 25 degrees C (68 degrees and 77 degrees F), in a dry place. Throw away any unused medicine after the expiration date. NOTE: This sheet is a summary. It may not cover all possible information. If you have questions about this medicine, talk to your doctor, pharmacist, or health care provider.  2014, [employer]. (2013-01-11 12:46:03)  

## 2013-09-27 NOTE — Progress Notes (Signed)
Patient ID: Alease FrameKay M Braun, female   DOB: 12/13/1955, 58 y.o.   MRN: [id number]  Presents with complaint of feeling anxious, angry and tearful for minimal irritations. States her 4 children are doing well, marriage is good. Pleas Kochravels often  with her husband for weeks at a time with his job. States does not understand why she is feeling this way when her life is good. Also having vaginal dryness/dyspareunia and feels hot all the time. TVH on no HRT. Denies any feelings of harming self or others. Questions if it is all hormonal.  Exam: Appears well, well groomed, well dressed. External genitalia minimal erythema at introitus, speculum exam vaginal walls atrophic. Bimanual no adnexal fullness or tenderness.  Atrophic vaginitis Anxiety mixed with depression  Plan: Options reviewed, denies need for counseling at this time, Effexor 75 once daily, increase to 150 after several weeks. Reviewed Effexor may also help with hot flushes. Reviewed importance of self-care, regular exercise, pleasurable activities and more home time.  Vagifem one applicator per vagina for 2 weeks and then twice weekly, vaginal lubricants with intercourse. Instructed to call if no relief of symptoms. Counseling encouraged as needed.

## 2014-05-01 ENCOUNTER — Encounter: Payer: Self-pay | Admitting: Women's Health

## 2014-07-11 ENCOUNTER — Encounter: Payer: Self-pay | Admitting: Women's Health

## 2014-07-11 ENCOUNTER — Ambulatory Visit (INDEPENDENT_AMBULATORY_CARE_PROVIDER_SITE_OTHER): Payer: BLUE CROSS/BLUE SHIELD | Admitting: Women's Health

## 2014-07-11 VITALS — BP 110/74 | Ht 64.0 in | Wt 157.0 lb

## 2014-07-11 DIAGNOSIS — N760 Acute vaginitis: Secondary | ICD-10-CM

## 2014-07-11 DIAGNOSIS — A499 Bacterial infection, unspecified: Secondary | ICD-10-CM

## 2014-07-11 DIAGNOSIS — Z13220 Encounter for screening for lipoid disorders: Secondary | ICD-10-CM

## 2014-07-11 DIAGNOSIS — Z01419 Encounter for gynecological examination (general) (routine) without abnormal findings: Secondary | ICD-10-CM

## 2014-07-11 DIAGNOSIS — B9689 Other specified bacterial agents as the cause of diseases classified elsewhere: Secondary | ICD-10-CM

## 2014-07-11 DIAGNOSIS — R1031 Right lower quadrant pain: Secondary | ICD-10-CM

## 2014-07-11 DIAGNOSIS — Z780 Asymptomatic menopausal state: Secondary | ICD-10-CM

## 2014-07-11 LAB — COMPREHENSIVE METABOLIC PANEL
ALT: 59 U/L — AB (ref 0–35)
AST: 35 U/L (ref 0–37)
Albumin: 4.2 g/dL (ref 3.5–5.2)
Alkaline Phosphatase: 122 U/L — ABNORMAL HIGH (ref 39–117)
BUN: 14 mg/dL (ref 6–23)
CHLORIDE: 103 meq/L (ref 96–112)
CO2: 28 meq/L (ref 19–32)
Calcium: 9.4 mg/dL (ref 8.4–10.5)
Creat: 0.77 mg/dL (ref 0.50–1.10)
Glucose, Bld: 79 mg/dL (ref 70–99)
Potassium: 4.2 mEq/L (ref 3.5–5.3)
Sodium: 140 mEq/L (ref 135–145)
Total Bilirubin: 0.5 mg/dL (ref 0.2–1.2)
Total Protein: 6.8 g/dL (ref 6.0–8.3)

## 2014-07-11 LAB — URINALYSIS W MICROSCOPIC + REFLEX CULTURE
BILIRUBIN URINE: NEGATIVE
CASTS: NONE SEEN
Crystals: NONE SEEN
Glucose, UA: NEGATIVE mg/dL
Ketones, ur: NEGATIVE mg/dL
Leukocytes, UA: NEGATIVE
NITRITE: NEGATIVE
PH: 5 (ref 5.0–8.0)
Protein, ur: NEGATIVE mg/dL
SPECIFIC GRAVITY, URINE: 1.015 (ref 1.005–1.030)
Urobilinogen, UA: 0.2 mg/dL (ref 0.0–1.0)
WBC, UA: NONE SEEN WBC/hpf (ref <3)

## 2014-07-11 LAB — LIPID PANEL
Cholesterol: 233 mg/dL — ABNORMAL HIGH (ref 0–200)
HDL: 85 mg/dL (ref >39)
LDL CALC: 93 mg/dL (ref 0–99)
Total CHOL/HDL Ratio: 2.7 Ratio
Triglycerides: 277 mg/dL — ABNORMAL HIGH (ref <150)
VLDL: 55 mg/dL — ABNORMAL HIGH (ref 0–40)

## 2014-07-11 LAB — WET PREP FOR TRICH, YEAST, CLUE
Trich, Wet Prep: NONE SEEN
WBC, Wet Prep HPF POC: NONE SEEN
Yeast Wet Prep HPF POC: NONE SEEN

## 2014-07-11 LAB — TSH: TSH: 0.924 u[IU]/mL (ref 0.350–4.500)

## 2014-07-11 MED ORDER — METRONIDAZOLE 0.75 % VA GEL: VAGINAL | Status: DC

## 2014-07-11 MED ORDER — ESTRADIOL 0.05 MG/24HR TD PTWK: 0.0500 mg | MEDICATED_PATCH | TRANSDERMAL | Status: DC

## 2014-07-11 NOTE — Addendum Note (Signed)
Addended by: Aura CampsWEBB, JENNIFER L on: 07/11/2014 04:07 PM   Modules accepted: Orders

## 2014-07-11 NOTE — Progress Notes (Signed)
Christina [name] 06/27/1956 [id number]    History:    Presents for annual exam with complaint of intermittent perceived vaginal spotting for 3 months, 2-3 times per week. Correlates with RLQ abdominal pain. Pain waxes and wanes from a pain of 3 to 8, feels like twisting, pain of 8  3 times a month. TVH for dysfunctional uterine bleeding. Hot flashes and night sweats. Stopped Effexor and estradiol vaginally because of side effects. History of abnormal Paps many years ago with normal ones after, normal after TVH. Marland Kitchen. Normal mammogram history, Last normal mammogram 2014. Normal colonoscopy 2015.   Past medical history, past surgical history, family history and social history were all reviewed and documented in the EPIC chart. 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 Four healthy kids. Family history MGM and aunt breast cancer, father prostate cancer, brain cancer (mother, deceased), aunt-ovarian cancer.  ROS:  A ROS was performed and pertinent positives and negatives are included. Denies itching, odor.  Exam:  Filed Vitals:   07/11/14 1447  BP: 110/74    General appearance:  Normal Thyroid:  Symmetrical, normal in size, without palpable masses or nodularity. Respiratory  Auscultation:  Clear without wheezing or rhonchi Cardiovascular  Auscultation:  Regular rate, without rubs, murmurs or gallops  Edema/varicosities:  Not grossly evident Abdominal  Soft, tender in right lower quadrant near McBurney's point. Guarding. No rebound tenderness, no referred pain  Liver/spleen:  No organomegaly noted  Hernia:  None appreciated  Skin  Inspection:  Grossly normal   Breasts: Examined lying and sitting.     Right: Without masses, retractions, discharge or axillary adenopathy.     Left: Without masses, retractions, discharge or axillary adenopathy. Gentitourinary   Inguinal/mons:  Normal without inguinal adenopathy  External genitalia:  Erythematous  BUS/Urethra/Skene's glands:  Normal  Vagina:   Normal, posterior fornix erythematous, scant white discharge  Cervix:  Absent  Uterus: Absent  Adnexa/parametria:     Rt: Without masses. Tenderness on palpation.   Lt: Without masses or tenderness.  Anus and perineum: Normal  Digital rectal exam: Normal sphincter tone without palpated masses or tenderness  Wet prep: Positive amine, few clue cells  Assessment/Plan:  59 y.o. MWF G4 for annual exam with complains of spotting and RLQ pain.   Right lower quadrant pain for 3 months TVH  - menopausal Symptoms Bacteria vaginosis Chronic Back pain managed by orthopedic with Fentanyl patches  Plan: Will schedule ultrasound.  Hormone replacement options discussed, Estradiol 0.05mg  patch, risk of clotting, stroke reviewed. Will schedule DEXA scan. MetroGel 0.75% vaginally for five days, alcohol precautions given. Bacterial infection prevention educated. SBE's, calcium rich diet, vitamin D 1000, increased exercise, decrease calories for weight loss encouraged. Yearly mammograms encouraged. Last normal pap 2014, new guidelines reviewed. CBC, CMP, Hep C, TSH, vit D, lipid, UA.     Coreyon Nicotra J WHNP, 3:24 PM 07/11/2014

## 2014-07-11 NOTE — Patient Instructions (Signed)
Health Recommendations for Postmenopausal Women Respected and ongoing research has looked at the most common causes of death, disability, and poor quality of life in postmenopausal women. The causes include heart disease, diseases of blood vessels, diabetes, depression, cancer, and bone loss (osteoporosis). Many things can be done to help lower the chances of developing these and other common problems. CARDIOVASCULAR DISEASE Heart Disease: A heart attack is a medical emergency. Know the signs and symptoms of a heart attack. Below are things women can do to reduce their risk for heart disease.   Do not smoke. If you smoke, quit.  Aim for a healthy weight. Being overweight causes many preventable deaths. Eat a healthy and balanced diet and drink an adequate amount of liquids.  Get moving. Make a commitment to be more physically active. Aim for 30 minutes of activity on most, if not all days of the week.  Eat for heart health. Choose a diet that is low in saturated fat and cholesterol and eliminate trans fat. Include whole grains, vegetables, and fruits. Read and understand the labels on food containers before buying.  Know your numbers. Ask your caregiver to check your blood pressure, cholesterol (total, HDL, LDL, triglycerides) and blood glucose. Work with your caregiver on improving your entire clinical picture.  High blood pressure. Limit or stop your table salt intake (try salt substitute and food seasonings). Avoid salty foods and drinks. Read labels on food containers before buying. Eating well and exercising can help control high blood pressure. STROKE  Stroke is a medical emergency. Stroke may be the result of a blood clot in a blood vessel in the brain or by a brain hemorrhage (bleeding). Know the signs and symptoms of a stroke. To lower the risk of developing a stroke:  Avoid fatty foods.  Quit smoking.  Control your diabetes, blood pressure, and irregular heart rate. THROMBOPHLEBITIS  (BLOOD CLOT) OF THE LEG  Becoming overweight and leading a stationary lifestyle may also contribute to developing blood clots. Controlling your diet and exercising will help lower the risk of developing blood clots. CANCER SCREENING  Breast Cancer: Take steps to reduce your risk of breast cancer.  You should practice "breast self-awareness." This means understanding the normal appearance and feel of your breasts and should include breast self-examination. Any changes detected, no matter how small, should be reported to your caregiver.  After age 40, you should have a clinical breast exam (CBE) every year.  Starting at age 40, you should consider having a mammogram (breast X-ray) every year.  If you have a family history of breast cancer, talk to your caregiver about genetic screening.  If you are at high risk for breast cancer, talk to your caregiver about having an MRI and a mammogram every year.  Intestinal or Stomach Cancer: Tests to consider are a rectal exam, fecal occult blood, sigmoidoscopy, and colonoscopy. Women who are high risk may need to be screened at an earlier age and more often.  Cervical Cancer:  Beginning at age 30, you should have a Pap test every 3 years as long as the past 3 Pap tests have been normal.  If you have had past treatment for cervical cancer or a condition that could lead to cancer, you need Pap tests and screening for cancer for at least 20 years after your treatment.  If you had a hysterectomy for a problem that was not cancer or a condition that could lead to cancer, then you no longer need Pap tests.    If you are between ages 65 and 70, and you have had normal Pap tests going back 10 years, you no longer need Pap tests.  If Pap tests have been discontinued, risk factors (such as a new sexual partner) need to be reassessed to determine if screening should be resumed.  Some medical problems can increase the chance of getting cervical cancer. In these  cases, your caregiver may recommend more frequent screening and Pap tests.  Uterine Cancer: If you have vaginal bleeding after reaching menopause, you should notify your caregiver.  Ovarian Cancer: Other than yearly pelvic exams, there are no reliable tests available to screen for ovarian cancer at this time except for yearly pelvic exams.  Lung Cancer: Yearly chest X-rays can detect lung cancer and should be done on high risk women, such as cigarette smokers and women with chronic lung disease (emphysema).  Skin Cancer: A complete body skin exam should be done at your yearly examination. Avoid overexposure to the sun and ultraviolet light lamps. Use a strong sun block cream when in the sun. All of these things are important for lowering the risk of skin cancer. MENOPAUSE Menopause Symptoms: Hormone therapy products are effective for treating symptoms associated with menopause:  Moderate to severe hot flashes.  Night sweats.  Mood swings.  Headaches.  Tiredness.  Loss of sex drive.  Insomnia.  Other symptoms. Hormone replacement carries certain risks, especially in older women. Women who use or are thinking about using estrogen or estrogen with progestin treatments should discuss that with their caregiver. Your caregiver will help you understand the benefits and risks. The ideal dose of hormone replacement therapy is not known. The Food and Drug Administration (FDA) has concluded that hormone therapy should be used only at the lowest doses and for the shortest amount of time to reach treatment goals.  OSTEOPOROSIS Protecting Against Bone Loss and Preventing Fracture If you use hormone therapy for prevention of bone loss (osteoporosis), the risks for bone loss must outweigh the risk of the therapy. Ask your caregiver about other medications known to be safe and effective for preventing bone loss and fractures. To guard against bone loss or fractures, the following is recommended:  If  you are younger than age 50, take 1000 mg of calcium and at least 600 mg of Vitamin D per day.  If you are older than age 50 but younger than age 70, take 1200 mg of calcium and at least 600 mg of Vitamin D per day.  If you are older than age 70, take 1200 mg of calcium and at least 800 mg of Vitamin D per day. Smoking and excessive alcohol intake increases the risk of osteoporosis. Eat foods rich in calcium and vitamin D and do weight bearing exercises several times a week as your caregiver suggests. DIABETES Diabetes Mellitus: If you have type I or type 2 diabetes, you should keep your blood sugar under control with diet, exercise, and recommended medication. Avoid starchy and fatty foods, and too many sweets. Being overweight can make diabetes control more difficult. COGNITION AND MEMORY Cognition and Memory: Menopausal hormone therapy is not recommended for the prevention of cognitive disorders such as Alzheimer's disease or memory loss.  DEPRESSION  Depression may occur at any age, but it is common in elderly women. This may be because of physical, medical, social (loneliness), or financial problems and needs. If you are experiencing depression because of medical problems and control of symptoms, talk to your caregiver about this. Physical   activity and exercise may help with mood and sleep. Community and volunteer involvement may improve your sense of value and worth. If you have depression and you feel that the problem is getting worse or becoming severe, talk to your caregiver about which treatment options are best for you. ACCIDENTS  Accidents are common and can be serious in elderly woman. Prepare your house to prevent accidents. Eliminate throw rugs, place hand bars in bath, shower, and toilet areas. Avoid wearing high heeled shoes or walking on wet, snowy, and icy areas. Limit or stop driving if you have vision or hearing problems, or if you feel you are unsteady with your movements and  reflexes. HEPATITIS C Hepatitis C is a type of viral infection affecting the liver. It is spread mainly through contact with blood from an infected person. It can be treated, but if left untreated, it can lead to severe liver damage over the years. Many people who are infected do not know that the virus is in their blood. If you are a "baby-boomer", it is recommended that you have one screening test for Hepatitis C. IMMUNIZATIONS  Several immunizations are important to consider having during your senior years, including:   Tetanus, diphtheria, and pertussis booster shot.  Influenza every year before the flu season begins.  Pneumonia vaccine.  Shingles vaccine.  Others, as indicated based on your specific needs. Talk to your caregiver about these. Document Released: 08/08/2005 Document Revised: 10/31/2013 Document Reviewed: 04/03/2008 [employer] Patient Information 2015 [employer]. This information is not intended to replace advice given to you by your health care provider. Make sure you discuss any questions you have with your health care provider.  

## 2014-07-12 ENCOUNTER — Encounter: Payer: Self-pay | Admitting: Women's Health

## 2014-07-12 ENCOUNTER — Ambulatory Visit (INDEPENDENT_AMBULATORY_CARE_PROVIDER_SITE_OTHER): Payer: BLUE CROSS/BLUE SHIELD

## 2014-07-12 ENCOUNTER — Ambulatory Visit (INDEPENDENT_AMBULATORY_CARE_PROVIDER_SITE_OTHER): Payer: BLUE CROSS/BLUE SHIELD | Admitting: Women's Health

## 2014-07-12 VITALS — Ht 64.0 in | Wt 157.0 lb

## 2014-07-12 DIAGNOSIS — R1031 Right lower quadrant pain: Secondary | ICD-10-CM

## 2014-07-12 DIAGNOSIS — E559 Vitamin D deficiency, unspecified: Secondary | ICD-10-CM

## 2014-07-12 LAB — CBC WITH DIFFERENTIAL/PLATELET
Basophils Absolute: 0 10*3/uL (ref 0.0–0.1)
Basophils Relative: 0 % (ref 0–1)
Eosinophils Absolute: 0.1 10*3/uL (ref 0.0–0.7)
Eosinophils Relative: 1 % (ref 0–5)
HCT: 40 % (ref 36.0–46.0)
Hemoglobin: 13.7 g/dL (ref 12.0–15.0)
Lymphocytes Relative: 32 % (ref 12–46)
Lymphs Abs: 1.7 10*3/uL (ref 0.7–4.0)
MCH: 32.1 pg (ref 26.0–34.0)
MCHC: 34.3 g/dL (ref 30.0–36.0)
MCV: 93.7 fL (ref 78.0–100.0)
MPV: 9.2 fL (ref 8.6–12.4)
Monocytes Absolute: 0.5 10*3/uL (ref 0.1–1.0)
Monocytes Relative: 9 % (ref 3–12)
Neutro Abs: 3.1 10*3/uL (ref 1.7–7.7)
Neutrophils Relative %: 58 % (ref 43–77)
PLATELETS: 321 10*3/uL (ref 150–400)
RBC: 4.27 MIL/uL (ref 3.87–5.11)
RDW: 14 % (ref 11.5–15.5)
WBC: 5.4 10*3/uL (ref 4.0–10.5)

## 2014-07-12 LAB — VITAMIN D 25 HYDROXY (VIT D DEFICIENCY, FRACTURES): Vit D, 25-Hydroxy: 25 ng/mL — ABNORMAL LOW (ref 30–100)

## 2014-07-12 LAB — HEPATITIS C ANTIBODY: HCV AB: NEGATIVE

## 2014-07-12 MED ORDER — VITAMIN D (ERGOCALCIFEROL) 1.25 MG (50000 UNIT) PO CAPS: 50000.0000 [IU] | ORAL_CAPSULE | ORAL | Status: DC

## 2014-07-12 NOTE — Progress Notes (Signed)
Presents for ultrasound to follow up on RLQ pain x 3 months noted at annual exam.   Exam: Well appearing. RLQ pain with palpation. [place]: S/P Hyst, T/V & T/A images. Rt ovary normal, atrophic. Lt ovary normal, atrophic. No apparent mass seen in Rt/Lt adnexal. Neg CDS Labs from annual exam reviewed-CMP: Alt 59, Alk phos 122 Lipid: Cholesterol 233, Triglycerides 277, VLDL 55 Vit D: 25   RLQ pain x 3 months normal GYN ultrasound Elevated Liver Enzymes Hyperlipidemia  Vitamin D deficiency   Plan: Findings of ultrasound reviewed, will schedule appointment with her GI doctor for RLQ pain. Reviewed medications that can increase liver enzymes. Avoid fatty diet, < 20 g saturated fat daily, fish oil supplement twice daily, continue to increase regular exercise 30 minutes most days of the week increase fiber to 25g. Vitamin D 50000 units for 12 weeks, then 2000 weekly after. Return in 4 months for CMP, lipid panel, Vitamin D. Urine culture pending. Aware to schedule lab appointment in 4 months. Blood pressure was elevated, was in pain will check away from office and follow-up with primary care if continues greater than 130/80.

## 2014-07-13 ENCOUNTER — Other Ambulatory Visit: Payer: Self-pay | Admitting: Women's Health

## 2014-07-13 ENCOUNTER — Ambulatory Visit (INDEPENDENT_AMBULATORY_CARE_PROVIDER_SITE_OTHER): Payer: BLUE CROSS/BLUE SHIELD

## 2014-07-13 ENCOUNTER — Other Ambulatory Visit: Payer: Self-pay | Admitting: Gynecology

## 2014-07-13 DIAGNOSIS — Z780 Asymptomatic menopausal state: Secondary | ICD-10-CM

## 2014-07-13 DIAGNOSIS — Z13820 Encounter for screening for osteoporosis: Secondary | ICD-10-CM

## 2014-08-01 ENCOUNTER — Other Ambulatory Visit: Payer: Self-pay | Admitting: Gastroenterology

## 2014-08-01 DIAGNOSIS — R1031 Right lower quadrant pain: Secondary | ICD-10-CM

## 2014-08-04 ENCOUNTER — Ambulatory Visit
Admission: RE | Admit: 2014-08-04 | Discharge: 2014-08-04 | Disposition: A | Discharge status: Home / Self Care | Payer: BLUE CROSS/BLUE SHIELD | Source: Ambulatory Visit | Attending: Gastroenterology | Admitting: Gastroenterology

## 2014-08-04 DIAGNOSIS — R1031 Right lower quadrant pain: Secondary | ICD-10-CM

## 2014-08-04 MED ORDER — IOHEXOL 300 MG/ML  SOLN
100.0000 mL | Freq: Once | INTRAMUSCULAR | Status: AC | PRN
  Administered 2014-08-04: 100 mL via INTRAVENOUS

## 2014-10-30 ENCOUNTER — Ambulatory Visit (INDEPENDENT_AMBULATORY_CARE_PROVIDER_SITE_OTHER): Payer: BLUE CROSS/BLUE SHIELD | Admitting: Women's Health

## 2014-10-30 ENCOUNTER — Other Ambulatory Visit: Payer: Self-pay | Admitting: *Deleted

## 2014-10-30 ENCOUNTER — Encounter: Payer: Self-pay | Admitting: Women's Health

## 2014-10-30 VITALS — BP 140/84 | Ht 64.0 in | Wt 161.0 lb

## 2014-10-30 DIAGNOSIS — E559 Vitamin D deficiency, unspecified: Secondary | ICD-10-CM

## 2014-10-30 DIAGNOSIS — L089 Local infection of the skin and subcutaneous tissue, unspecified: Secondary | ICD-10-CM | POA: Diagnosis not present

## 2014-10-30 LAB — COMPREHENSIVE METABOLIC PANEL
ALBUMIN: 4 g/dL (ref 3.5–5.2)
ALK PHOS: 112 U/L (ref 39–117)
ALT: 34 U/L (ref 0–35)
AST: 27 U/L (ref 0–37)
BILIRUBIN TOTAL: 0.4 mg/dL (ref 0.2–1.2)
BUN: 14 mg/dL (ref 6–23)
CHLORIDE: 105 meq/L (ref 96–112)
CO2: 26 mEq/L (ref 19–32)
CREATININE: 0.7 mg/dL (ref 0.50–1.10)
Calcium: 8.9 mg/dL (ref 8.4–10.5)
Glucose, Bld: 87 mg/dL (ref 70–99)
POTASSIUM: 4.2 meq/L (ref 3.5–5.3)
SODIUM: 141 meq/L (ref 135–145)
Total Protein: 6.8 g/dL (ref 6.0–8.3)

## 2014-10-30 LAB — LIPID PANEL
Cholesterol: 195 mg/dL (ref 0–200)
HDL: 80 mg/dL (ref >=46)
LDL CALC: 97 mg/dL (ref 0–99)
Total CHOL/HDL Ratio: 2.4 Ratio
Triglycerides: 89 mg/dL (ref <150)
VLDL: 18 mg/dL (ref 0–40)

## 2014-10-30 MED ORDER — SULFAMETHOXAZOLE-TRIMETHOPRIM 800-160 MG PO TABS: 1.0000 | ORAL_TABLET | Freq: Two times a day (BID) | ORAL | Status: DC

## 2014-10-30 NOTE — Patient Instructions (Signed)
Spider Bite Spider bites are not common. Most spider bites do not cause serious problems. The elderly, very Christina [name] children, and people with certain existing medical conditions are more likely to experience significant symptoms. SYMPTOMS  Spider bites may not cause any pain at first. Within 1 or 2 days of the bite, there may be swelling, redness, and pain in the bite area. However, some spider bites can cause pain within the first hour. TREATMENT  Your caregiver may prescribe antibiotic medicine if a bacterial infection develops in the bite. However, not all spider bites require antibiotics or prescription medicines.  HOME CARE INSTRUCTIONS  Do not scratch the bite area.  Keep the bite area clean and dry. Wash the area with soap and water as directed.  Put ice or cool compresses on the bite area.  Put ice in a plastic bag.  Place a towel between your skin and the bag.  Leave the ice on for 20 minutes, 4 times a day for the first 2 to 3 days, or as directed.  Keep the bite area elevated above the level of your heart. This helps reduce redness and swelling.  Only take over-the-counter or prescription medicines as directed by your caregiver.  If you are given antibiotics, take them as directed. Finish them even if you start to feel better. You may need a tetanus shot if:  You cannot remember when you had your last tetanus shot.  You have never had a tetanus shot.  The injury broke your skin. If you get a tetanus shot, your arm may swell, get red, and feel warm to the touch. This is common and not a problem. If you need a tetanus shot and you choose not to have one, there is a rare chance of getting tetanus. Sickness from tetanus can be serious. SEEK MEDICAL CARE IF: Your bite is not better after 3 days of treatment. SEEK IMMEDIATE MEDICAL CARE IF:  Your bite turns purple or develops increased swelling, pain, or redness.  You develop shortness of breath or chest pain.  You have  muscle cramps or painful muscle spasms.  You develop abdominal pain, nausea, or vomiting.  You feel unusually tired or sleepy. MAKE SURE YOU:  Understand these instructions.  Will watch your condition.  Will get help right away if you are not doing well or get worse. Document Released: 07/24/2004 Document Revised: 09/08/2011 Document Reviewed: 01/15/2011 [employer] Patient Information 2015 [employer]. This information is not intended to replace advice given to you by your health care provider. Make sure you discuss any questions you have with your health care provider.  

## 2014-10-30 NOTE — Addendum Note (Signed)
Addended by: Kem ParkinsonBARNES, Agness Sibrian on: 10/30/2014 03:07 PM   Modules accepted: Orders

## 2014-10-30 NOTE — Progress Notes (Signed)
Patient ID: Christina [name], female   DOB: 10/20/1955, 59 y.o.   MRN: [id number] Presents for recheck of labs, had a low vitamin D, elevated AST. States was bitten by a insect one week ago,  most likely a spider on her right buttocks. Was quite tender, red, firm, had small amount of drainage now scabbed over. Denies a fever or any other problems.  Exam: Appears well. Right buttocks 4 cm indurated area with center scab, warmth, minimal tenderness.  Infected insect bite  Plan: Septra twice daily for 3 days #6, instructed to call if area not responding in 3 days. Open to air, loose clothing, topical antibiotic. Recheck CMP, vitamin D and lipid panel.

## 2014-10-31 ENCOUNTER — Other Ambulatory Visit: Payer: Self-pay | Admitting: *Deleted

## 2014-10-31 DIAGNOSIS — E559 Vitamin D deficiency, unspecified: Secondary | ICD-10-CM

## 2014-10-31 LAB — VITAMIN D 25 HYDROXY (VIT D DEFICIENCY, FRACTURES): Vit D, 25-Hydroxy: 25 ng/mL — ABNORMAL LOW (ref 30–100)

## 2014-10-31 MED ORDER — VITAMIN D (ERGOCALCIFEROL) 1.25 MG (50000 UNIT) PO CAPS: 50000.0000 [IU] | ORAL_CAPSULE | ORAL | Status: DC

## 2014-11-22 ENCOUNTER — Other Ambulatory Visit: Payer: Self-pay | Admitting: Women's Health

## 2014-11-22 MED ORDER — FENTANYL 50 MCG/HR TD PT72: 50.0000 ug | MEDICATED_PATCH | TRANSDERMAL | Status: DC

## 2015-02-27 ENCOUNTER — Other Ambulatory Visit: Payer: Self-pay | Admitting: Women's Health

## 2015-02-27 ENCOUNTER — Ambulatory Visit (INDEPENDENT_AMBULATORY_CARE_PROVIDER_SITE_OTHER): Payer: BLUE CROSS/BLUE SHIELD

## 2015-02-27 ENCOUNTER — Encounter: Payer: Self-pay | Admitting: Women's Health

## 2015-02-27 ENCOUNTER — Ambulatory Visit (INDEPENDENT_AMBULATORY_CARE_PROVIDER_SITE_OTHER): Payer: BLUE CROSS/BLUE SHIELD | Admitting: Women's Health

## 2015-02-27 VITALS — BP 150/92

## 2015-02-27 DIAGNOSIS — N8300 Follicular cyst of ovary, unspecified side: Secondary | ICD-10-CM

## 2015-02-27 DIAGNOSIS — E559 Vitamin D deficiency, unspecified: Secondary | ICD-10-CM

## 2015-02-27 DIAGNOSIS — R1031 Right lower quadrant pain: Secondary | ICD-10-CM

## 2015-02-27 DIAGNOSIS — Z8041 Family history of malignant neoplasm of ovary: Secondary | ICD-10-CM

## 2015-02-27 DIAGNOSIS — N8331 Acquired atrophy of ovary: Secondary | ICD-10-CM | POA: Diagnosis not present

## 2015-02-27 DIAGNOSIS — N83319 Acquired atrophy of ovary, unspecified side: Secondary | ICD-10-CM

## 2015-02-27 NOTE — Patient Instructions (Signed)
BRCA-1 and BRCA-2 BRCA-1 and BRCA-2 are 2 genes that are linked with hereditary breast and ovarian cancers. About 200,000 women are diagnosed with invasive breast cancer each year and about 23,000 with ovarian cancer (according to the American Cancer Society). Of these cancers, about 5% to 10% will be due to a mutation in one of the BRCA genes. Men can also inherit an increased risk of developing breast cancer, primarily from an alteration in the BRCA-2 gene.  Individuals with mutations in BRCA1 or BRCA2 have significantly elevated risks for breast cancer (up to 80% lifetime risk), ovarian cancer (up to 40% lifetime risk), bilateral breast cancer and other types of cancers. BRCA mutations are inherited and passed from generation to generation. One half of the time, they are passed from the father's side of the family.  The DNA in white blood cells is used to detect mutations in the BRCA genes. While the gene products (proteins) of the BRCA genes act only in breast and ovarian tissue, the genes are present in every cell of the body and blood is the most easily accessible source of that DNA. PREPARATION FOR TEST The test for BRCA mutations is done on a blood sample collected by needle from a vein in the arm. The test does not require surgical biopsy of breast or ovarian tissue.  NORMAL FINDINGS No genetic mutations. Ranges for normal findings may vary among different laboratories and hospitals. You should always check with your doctor after having lab work or other tests done to discuss the meaning of your test results and whether your values are considered within normal limits. MEANING OF TEST  Your caregiver will go over the test results with you and discuss the importance and meaning of your results, as well as treatment options and the need for additional tests if necessary. OBTAINING THE TEST RESULTS It is your responsibility to obtain your test results. Ask the lab or department performing the test  when and how you will get your results. OTHER THINGS TO KNOW Your test results may have implications for other family members. When one member of a family is tested for BRCA mutations, issues often arise about how or whether to share this information with other family members. Seek advice from a genetic counselor about communication of result with your family members.  Pre and post test consultation with a health care provider knowledgeable about genetic testing cannot be overemphasized.  There are many issues to be considered when preparing for a genetic test and upon learning the results, and a genetic counselor has the knowledge and experience to help you sort through them.  If the BRCA test is positive, the options include increased frequency of check-ups (e.g., mammography, blood tests for CA-125, or transvaginal ultrasonography); medications that could reduce risk (e.g., oral contraceptives or tamoxifen); or surgical removal of the ovaries or breasts. There are a number of variables involved and it is important to discuss your options with your doctor and genetic counselor. Research studies have reported that for every 1000 women negative for BRCA mutations, between 12 and 45 of them will develop breast cancer by age 50 and between 3 and 4 will develop ovarian cancer by age 50. The risk increases with age. The test can be ordered by a doctor, preferably by one who can also offer genetic counseling. The blood sample will be sent to a laboratory that specializes in BRCA testing. The American Society of Clinical Oncology and the National Breast Cancer Coalition encourage women seeking the   test to participate in long-term outcome studies to help gather information on the effectiveness of different check-up and treatment options. Document Released: 07/10/2004 Document Revised: 09/08/2011 Document Reviewed: 09/16/2013 [employer] Patient Information 2015 [place], [place]. This information is not intended to  replace advice given to you by your health care provider. Make sure you discuss any questions you have with your health care provider.

## 2015-02-27 NOTE — Progress Notes (Signed)
Patient ID: Christina [name], female   DOB: 12-18-1955, 59 y.o.   MRN: [id number] Presents with complaint of right lower quadrant pain persistent for years. Normal ultrasound 06/2014, negative CT 07/2014, normal ovaries and appendix.Marland Kitchen Four family members with ovarian cancer. Mother had TAH with BSO died of brain cancer. First cousin age 57 in treatment now for ovarian cancer, MGM and  aunt died of ovarian cancer. BRCA status unknown on any relatives. Requesting CA 125 and questions if she can have oophorectomy. Hysterectomy for endometriosis in 06.  Exam: Appears well. Blood pressure elevated 150/92 Ultrasound: S/P hysterectomy. Right ovary normal. Left ovary echo-free follicle 4 x 5 mm tiny scattered microcalcifications. Negative cul-de-sac. No apparent masses seen in right or left adnexal.  Normal GYN ultrasound Family history ovarian cancer Persistent right lower quadrant discomfort questionable adhesions versus endometriosis  Plan: CA-125. Encouraged to discuss with cousin  BRCA testing. Oophorectomy - surgical risks reviewed. Recheck blood pressure, if continues greater than 130/80 follow-up with primary care.

## 2015-02-28 LAB — CA 125: CA 125: 5 U/mL (ref <35)

## 2015-02-28 LAB — VITAMIN D 25 HYDROXY (VIT D DEFICIENCY, FRACTURES): VIT D 25 HYDROXY: 41 ng/mL (ref 30–100)

## 2015-10-01 DIAGNOSIS — Z79891 Long term (current) use of opiate analgesic: Secondary | ICD-10-CM | POA: Diagnosis not present

## 2015-10-01 DIAGNOSIS — M47816 Spondylosis without myelopathy or radiculopathy, lumbar region: Secondary | ICD-10-CM | POA: Diagnosis not present

## 2015-10-01 DIAGNOSIS — M5410 Radiculopathy, site unspecified: Secondary | ICD-10-CM | POA: Diagnosis not present

## 2015-10-01 DIAGNOSIS — G8929 Other chronic pain: Secondary | ICD-10-CM | POA: Diagnosis not present

## 2015-10-23 DIAGNOSIS — M533 Sacrococcygeal disorders, not elsewhere classified: Secondary | ICD-10-CM | POA: Diagnosis not present

## 2015-10-23 DIAGNOSIS — M5442 Lumbago with sciatica, left side: Secondary | ICD-10-CM | POA: Diagnosis not present

## 2015-10-29 ENCOUNTER — Encounter: Payer: Self-pay | Admitting: Women's Health

## 2015-10-29 ENCOUNTER — Ambulatory Visit (INDEPENDENT_AMBULATORY_CARE_PROVIDER_SITE_OTHER): Payer: BLUE CROSS/BLUE SHIELD | Admitting: Women's Health

## 2015-10-29 VITALS — BP 138/80 | Ht 64.0 in | Wt 161.0 lb

## 2015-10-29 DIAGNOSIS — N3001 Acute cystitis with hematuria: Secondary | ICD-10-CM | POA: Diagnosis not present

## 2015-10-29 DIAGNOSIS — B9689 Other specified bacterial agents as the cause of diseases classified elsewhere: Secondary | ICD-10-CM

## 2015-10-29 DIAGNOSIS — R350 Frequency of micturition: Secondary | ICD-10-CM

## 2015-10-29 DIAGNOSIS — B009 Herpesviral infection, unspecified: Secondary | ICD-10-CM

## 2015-10-29 DIAGNOSIS — A499 Bacterial infection, unspecified: Secondary | ICD-10-CM | POA: Diagnosis not present

## 2015-10-29 DIAGNOSIS — N760 Acute vaginitis: Secondary | ICD-10-CM

## 2015-10-29 DIAGNOSIS — N898 Other specified noninflammatory disorders of vagina: Secondary | ICD-10-CM | POA: Diagnosis not present

## 2015-10-29 LAB — WET PREP FOR TRICH, YEAST, CLUE
Trich, Wet Prep: NONE SEEN
Yeast Wet Prep HPF POC: NONE SEEN

## 2015-10-29 LAB — URINALYSIS W MICROSCOPIC + REFLEX CULTURE
Bilirubin Urine: NEGATIVE
Casts: NONE SEEN [LPF]
Crystals: NONE SEEN [HPF]
GLUCOSE, UA: NEGATIVE
PH: 5 (ref 5.0–8.0)
Yeast: NONE SEEN [HPF]

## 2015-10-29 MED ORDER — VALACYCLOVIR HCL 500 MG PO TABS: ORAL_TABLET | ORAL | Status: DC

## 2015-10-29 MED ORDER — FLUCONAZOLE 150 MG PO TABS: 150.0000 mg | ORAL_TABLET | Freq: Once | ORAL | Status: DC

## 2015-10-29 MED ORDER — SULFAMETHOXAZOLE-TRIMETHOPRIM 800-160 MG PO TABS: 1.0000 | ORAL_TABLET | Freq: Two times a day (BID) | ORAL | Status: DC

## 2015-10-29 MED ORDER — METRONIDAZOLE 0.75 % VA GEL: VAGINAL | Status: DC

## 2015-10-29 NOTE — Addendum Note (Signed)
Addended by: Harrington ChallengerYOUNG, NANCY J on: 10/29/2015 01:50 PM   Modules accepted: Level of Service

## 2015-10-29 NOTE — Patient Instructions (Signed)
Urinary Tract Infection Urinary tract infections (UTIs) can develop anywhere along your urinary tract. Your urinary tract is your body's drainage system for removing wastes and extra water. Your urinary tract includes two kidneys, two ureters, a bladder, and a urethra. Your kidneys are a pair of bean-shaped organs. Each kidney is about the size of your fist. They are located below your ribs, one on each side of your spine. CAUSES Infections are caused by microbes, which are microscopic organisms, including fungi, viruses, and bacteria. These organisms are so small that they can only be seen through a microscope. Bacteria are the microbes that most commonly cause UTIs. SYMPTOMS  Symptoms of UTIs may vary by age and gender of the patient and by the location of the infection. Symptoms in young women typically include a frequent and intense urge to urinate and a painful, burning feeling in the bladder or urethra during urination. Older women and men are more likely to be tired, shaky, and weak and have muscle aches and abdominal pain. A fever may mean the infection is in your kidneys. Other symptoms of a kidney infection include pain in your back or sides below the ribs, nausea, and vomiting. DIAGNOSIS To diagnose a UTI, your caregiver will ask you about your symptoms. Your caregiver will also ask you to provide a urine sample. The urine sample will be tested for bacteria and white blood cells. White blood cells are made by your body to help fight infection. TREATMENT  Typically, UTIs can be treated with medication. Because most UTIs are caused by a bacterial infection, they usually can be treated with the use of antibiotics. The choice of antibiotic and length of treatment depend on your symptoms and the type of bacteria causing your infection. HOME CARE INSTRUCTIONS  If you were prescribed antibiotics, take them exactly as your caregiver instructs you. Finish the medication even if you feel better after  you have only taken some of the medication.  Drink enough water and fluids to keep your urine clear or pale yellow.  Avoid caffeine, tea, and carbonated beverages. They tend to irritate your bladder.  Empty your bladder often. Avoid holding urine for long periods of time.  Empty your bladder before and after sexual intercourse.  After a bowel movement, women should cleanse from front to back. Use each tissue only once. SEEK MEDICAL CARE IF:   You have back pain.  You develop a fever.  Your symptoms do not begin to resolve within 3 days. SEEK IMMEDIATE MEDICAL CARE IF:   You have severe back pain or lower abdominal pain.  You develop chills.  You have nausea or vomiting.  You have continued burning or discomfort with urination. MAKE SURE YOU:   Understand these instructions.  Will watch your condition.  Will get help right away if you are not doing well or get worse.   This information is not intended to replace advice given to you by your health care provider. Make sure you discuss any questions you have with your health care provider.   Document Released: 03/26/2005 Document Revised: 03/07/2015 Document Reviewed: 07/25/2011 Elsevier Interactive Patient Education 2016 Elsevier Inc. Bacterial Vaginosis Bacterial vaginosis is a vaginal infection that occurs when the normal balance of bacteria in the vagina is disrupted. It results from an overgrowth of certain bacteria. This is the most common vaginal infection in women of childbearing age. Treatment is important to prevent complications, especially in pregnant women, as it can cause a premature delivery. CAUSES  Bacterial   vaginosis is caused by an increase in harmful bacteria that are normally present in smaller amounts in the vagina. Several different kinds of bacteria can cause bacterial vaginosis. However, the reason that the condition develops is not fully understood. RISK FACTORS Certain activities or behaviors can  put you at an increased risk of developing bacterial vaginosis, including:  Having a new sex partner or multiple sex partners.  Douching.  Using an intrauterine device (IUD) for contraception. Women do not get bacterial vaginosis from toilet seats, bedding, swimming pools, or contact with objects around them. SIGNS AND SYMPTOMS  Some women with bacterial vaginosis have no signs or symptoms. Common symptoms include:  Grey vaginal discharge.  A fishlike odor with discharge, especially after sexual intercourse.  Itching or burning of the vagina and vulva.  Burning or pain with urination. DIAGNOSIS  Your health care provider will take a medical history and examine the vagina for signs of bacterial vaginosis. A sample of vaginal fluid may be taken. Your health care provider will look at this sample under a microscope to check for bacteria and abnormal cells. A vaginal pH test may also be done.  TREATMENT  Bacterial vaginosis may be treated with antibiotic medicines. These may be given in the form of a pill or a vaginal cream. A second round of antibiotics may be prescribed if the condition comes back after treatment. Because bacterial vaginosis increases your risk for sexually transmitted diseases, getting treated can help reduce your risk for chlamydia, gonorrhea, HIV, and herpes. HOME CARE INSTRUCTIONS   Only take over-the-counter or prescription medicines as directed by your health care provider.  If antibiotic medicine was prescribed, take it as directed. Make sure you finish it even if you start to feel better.  Tell all sexual partners that you have a vaginal infection. They should see their health care provider and be treated if they have problems, such as a mild rash or itching.  During treatment, it is important that you follow these instructions:  Avoid sexual activity or use condoms correctly.  Do not douche.  Avoid alcohol as directed by your health care provider.  Avoid  breastfeeding as directed by your health care provider. SEEK MEDICAL CARE IF:   Your symptoms are not improving after 3 days of treatment.  You have increased discharge or pain.  You have a fever. MAKE SURE YOU:   Understand these instructions.  Will watch your condition.  Will get help right away if you are not doing well or get worse. FOR MORE INFORMATION  Centers for Disease Control and Prevention, Division of STD Prevention: www.cdc.gov/std American Sexual Health Association (ASHA): www.ashastd.org    This information is not intended to replace advice given to you by your health care provider. Make sure you discuss any questions you have with your health care provider.   Document Released: 06/16/2005 Document Revised: 07/07/2014 Document Reviewed: 01/26/2013 Elsevier Interactive Patient Education 2016 Elsevier Inc.  

## 2015-10-29 NOTE — Progress Notes (Addendum)
Patient ID: Christina FrameKay M [name], female   DOB: 06/03/1956, 60 y.o.   MRN: [id number] Presents with complaint of burning with urination especially at end of stream, vaginal discharge with irritation, questionable odor. Denies fever, abdominal pain. Has used nothing over-the-counter. TVH for endometriosis on no HRT. Same partner. Reports GI bug last week with diarrhea, symptoms resolved.  Exam: Appears well. Abdomen soft nontender, no CVAT. External genitalia within normal limits, speculum exam pink discharge with odor, wet prep positive for many clues, TNTC bacteria.  bimanual mild tenderness in the vaginal area, adnexa non-tender. UA: +2 blood, +2 protein, +1 leukocytes, greater than 60 WBCs, 40-60 RBCs, many bacteria.  Bacteria vaginosis UTI with hematuria  Plan: Options reviewed, will try MetroGel vaginal cream 1 applicator at bedtime 5, alcohol precautions reviewed. Septra twice a day for 3 days. Prescription for Diflucan 150 times one dose if vaginal itching occurs after antibiotics. Recheck clean-catch UA in 2 weeks to check for resolution of hematuria. UTI prevention discussed. Instructed to call if symptoms do not resolve. Annual exam in June.

## 2015-10-30 ENCOUNTER — Telehealth: Payer: Self-pay | Admitting: *Deleted

## 2015-10-30 DIAGNOSIS — Z79891 Long term (current) use of opiate analgesic: Secondary | ICD-10-CM | POA: Diagnosis not present

## 2015-10-30 DIAGNOSIS — G8929 Other chronic pain: Secondary | ICD-10-CM | POA: Diagnosis not present

## 2015-10-30 DIAGNOSIS — M47816 Spondylosis without myelopathy or radiculopathy, lumbar region: Secondary | ICD-10-CM | POA: Diagnosis not present

## 2015-10-30 DIAGNOSIS — M5410 Radiculopathy, site unspecified: Secondary | ICD-10-CM | POA: Diagnosis not present

## 2015-10-30 NOTE — Telephone Encounter (Signed)
Left the below on pt voicemail. 

## 2015-10-30 NOTE — Telephone Encounter (Signed)
Pt was seen yesterday prescribed bactrim 800-160 mg tablet states you notes small amount of blood yesterday, today pt said blood is thicker with what appears to be small clots.  Pt said she is not having a period flow, light bleeding, pt said you may call her if needed. [phone]

## 2015-10-30 NOTE — Telephone Encounter (Signed)
Continue MetroGel at bedtime she was also treated for BV. Increase fluids, finish out Septra. She has had a hysterectomy. But she did have a pink discharge.

## 2015-10-31 LAB — URINE CULTURE: Colony Count: 25000

## 2015-11-27 DIAGNOSIS — M47816 Spondylosis without myelopathy or radiculopathy, lumbar region: Secondary | ICD-10-CM | POA: Diagnosis not present

## 2015-11-27 DIAGNOSIS — G8929 Other chronic pain: Secondary | ICD-10-CM | POA: Diagnosis not present

## 2015-11-27 DIAGNOSIS — Z79891 Long term (current) use of opiate analgesic: Secondary | ICD-10-CM | POA: Diagnosis not present

## 2015-12-14 ENCOUNTER — Encounter: Payer: BLUE CROSS/BLUE SHIELD | Admitting: Women's Health

## 2015-12-18 ENCOUNTER — Ambulatory Visit (INDEPENDENT_AMBULATORY_CARE_PROVIDER_SITE_OTHER): Payer: BLUE CROSS/BLUE SHIELD | Admitting: Women's Health

## 2015-12-18 ENCOUNTER — Encounter: Payer: Self-pay | Admitting: Women's Health

## 2015-12-18 ENCOUNTER — Other Ambulatory Visit: Payer: Self-pay | Admitting: Women's Health

## 2015-12-18 VITALS — BP 118/80 | Ht 64.0 in | Wt 149.0 lb

## 2015-12-18 DIAGNOSIS — Z90710 Acquired absence of both cervix and uterus: Secondary | ICD-10-CM

## 2015-12-18 DIAGNOSIS — B009 Herpesviral infection, unspecified: Secondary | ICD-10-CM

## 2015-12-18 DIAGNOSIS — Z1231 Encounter for screening mammogram for malignant neoplasm of breast: Secondary | ICD-10-CM

## 2015-12-18 DIAGNOSIS — Z01419 Encounter for gynecological examination (general) (routine) without abnormal findings: Secondary | ICD-10-CM

## 2015-12-18 DIAGNOSIS — Z13220 Encounter for screening for lipoid disorders: Secondary | ICD-10-CM

## 2015-12-18 LAB — CBC WITH DIFFERENTIAL/PLATELET
BASOS ABS: 0 {cells}/uL (ref 0–200)
Basophils Relative: 0 %
EOS ABS: 0 {cells}/uL — AB (ref 15–500)
Eosinophils Relative: 0 %
HEMATOCRIT: 40.7 % (ref 35.0–45.0)
HEMOGLOBIN: 13.6 g/dL (ref 11.7–15.5)
LYMPHS ABS: 1206 {cells}/uL (ref 850–3900)
Lymphocytes Relative: 18 %
MCH: 32.5 pg (ref 27.0–33.0)
MCHC: 33.4 g/dL (ref 32.0–36.0)
MCV: 97.1 fL (ref 80.0–100.0)
MONO ABS: 670 {cells}/uL (ref 200–950)
MPV: 9.9 fL (ref 7.5–12.5)
Monocytes Relative: 10 %
NEUTROS PCT: 72 %
Neutro Abs: 4824 cells/uL (ref 1500–7800)
Platelets: 313 10*3/uL (ref 140–400)
RBC: 4.19 MIL/uL (ref 3.80–5.10)
RDW: 14.3 % (ref 11.0–15.0)
WBC: 6.7 10*3/uL (ref 3.8–10.8)

## 2015-12-18 LAB — LIPID PANEL
CHOL/HDL RATIO: 1.9 ratio (ref <=5.0)
Cholesterol: 240 mg/dL — ABNORMAL HIGH (ref 125–200)
HDL: 127 mg/dL (ref >=46)
LDL CALC: 102 mg/dL (ref <130)
Triglycerides: 57 mg/dL (ref <150)
VLDL: 11 mg/dL (ref <30)

## 2015-12-18 LAB — COMPREHENSIVE METABOLIC PANEL
ALBUMIN: 4.2 g/dL (ref 3.6–5.1)
ALT: 19 U/L (ref 6–29)
AST: 17 U/L (ref 10–35)
Alkaline Phosphatase: 85 U/L (ref 33–130)
BILIRUBIN TOTAL: 0.6 mg/dL (ref 0.2–1.2)
BUN: 14 mg/dL (ref 7–25)
CALCIUM: 9.1 mg/dL (ref 8.6–10.4)
CHLORIDE: 106 mmol/L (ref 98–110)
CO2: 27 mmol/L (ref 20–31)
Creat: 0.75 mg/dL (ref 0.50–1.05)
Glucose, Bld: 93 mg/dL (ref 65–99)
Potassium: 4.4 mmol/L (ref 3.5–5.3)
Sodium: 140 mmol/L (ref 135–146)
TOTAL PROTEIN: 6.8 g/dL (ref 6.1–8.1)

## 2015-12-18 MED ORDER — VALACYCLOVIR HCL 500 MG PO TABS: ORAL_TABLET | ORAL | Status: DC

## 2015-12-18 NOTE — Patient Instructions (Signed)

## 2015-12-18 NOTE — Progress Notes (Signed)
Christina [name] 09/28/1955 [id number]    History:    Presents for annual exam.  TVH in 2006 endometriosis and DUB with menopausal symptoms on no hormone replacement therapy, reports hot flashes and night sweats have improved.  History of an abnormal Pap many years ago with normals after. Mammogram scheduled for end of June, normal mammogram 2014. 2016 T score -2.1 at femoral neck FRAX 9.2/1.3%.  Colonoscopy in 2015 was normal.  History of low vitamin D, currently taking Vitamin D supplement.  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      Past medical history, past surgical history, family history and social history were all reviewed and documented in the EPIC chart.  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  4 children, 3 grandchildren, another grandchild expected to be delivered this week.  FH of ovarian cancer (aunt), and breast cancer (aunt).  ROS:  A ROS was performed and pertinent positives and negatives are included.  Exam:  Filed Vitals:   12/18/15 1008  BP: 118/80    General appearance:  Normal Thyroid:  Symmetrical, normal in size, without palpable masses or nodularity. Respiratory  Auscultation:  Clear without wheezing or rhonchi Cardiovascular  Auscultation:  Regular rate, without rubs, murmurs or gallops  Edema/varicosities:  Not grossly evident Abdominal  Soft,nontender, without masses, guarding or rebound.  Liver/spleen:  No organomegaly noted  Hernia:  None appreciated  Skin  Inspection:  Grossly normal   Breasts: Examined lying and sitting.     Right: Without masses, retractions, discharge or axillary adenopathy.     Left: Without masses, retractions, discharge or axillary adenopathy. Gentitourinary   Inguinal/mons:  Normal without inguinal adenopathy  External genitalia:  Normal  BUS/Urethra/Skene's glands:  Normal  Vagina:  Atrophic with no erythema or discharge noted.  Cervix:   Absent.  Uterus:  Absent.  Adnexa/parametria:     Rt: Without masses or tenderness.   Lt: Without masses or tenderness.  Anus and perineum: Normal  Digital rectal exam: Normal sphincter tone without palpated masses or tenderness  Assessment/Plan:  60 y.o. MWF G4P4 for annual exam with no complaints.  TVH with occasional menopausal symptoms on no HRT Vaginal atrophy Low Vitamin D Osteopenia without elevated FRAX Depression-stable on Zoloft per primary care  Plan:  SBEs, mammogram scheduled for end of June, reviewed importance of annual screen.  Weight maintenance and exercise, vitamin D 1000mg  daily. Reviewed importance of weightbearing exercise. Next colonoscopy due in 2025.  Counseled on sunscreen use and visiting dermatologist for skin check. Continue vaginal lubricants with intercourse. Congratulations given on upcoming birth of new grandchild. CBC, CMP, lipid panel, vitamin D, UA, Pap new screening guidelines reviewed.    Harrington ChallengerYOUNG,NANCY J WHNP, 11:03 AM 12/18/2015

## 2015-12-19 LAB — URINALYSIS W MICROSCOPIC + REFLEX CULTURE
Bilirubin Urine: NEGATIVE
Casts: NONE SEEN [LPF]
Glucose, UA: NEGATIVE
Hgb urine dipstick: NEGATIVE
Ketones, ur: NEGATIVE
Leukocytes, UA: NEGATIVE
Nitrite: NEGATIVE
PROTEIN: NEGATIVE
SPECIFIC GRAVITY, URINE: 1.024 (ref 1.001–1.035)
WBC UA: NONE SEEN WBC/HPF (ref <=5)
YEAST: NONE SEEN [HPF]
pH: 5.5 (ref 5.0–8.0)

## 2015-12-19 LAB — VITAMIN D 25 HYDROXY (VIT D DEFICIENCY, FRACTURES): VIT D 25 HYDROXY: 33 ng/mL (ref 30–100)

## 2015-12-20 LAB — URINE CULTURE

## 2015-12-25 ENCOUNTER — Ambulatory Visit: Payer: BLUE CROSS/BLUE SHIELD

## 2015-12-27 DIAGNOSIS — M545 Low back pain: Secondary | ICD-10-CM | POA: Diagnosis not present

## 2015-12-27 DIAGNOSIS — G8929 Other chronic pain: Secondary | ICD-10-CM | POA: Diagnosis not present

## 2015-12-27 DIAGNOSIS — M5416 Radiculopathy, lumbar region: Secondary | ICD-10-CM | POA: Diagnosis not present

## 2015-12-27 DIAGNOSIS — Z79891 Long term (current) use of opiate analgesic: Secondary | ICD-10-CM | POA: Diagnosis not present

## 2016-01-24 ENCOUNTER — Ambulatory Visit: Payer: BLUE CROSS/BLUE SHIELD

## 2016-01-28 DIAGNOSIS — Z79891 Long term (current) use of opiate analgesic: Secondary | ICD-10-CM | POA: Diagnosis not present

## 2016-01-28 DIAGNOSIS — M5416 Radiculopathy, lumbar region: Secondary | ICD-10-CM | POA: Diagnosis not present

## 2016-01-28 DIAGNOSIS — M545 Low back pain: Secondary | ICD-10-CM | POA: Diagnosis not present

## 2016-01-28 DIAGNOSIS — G8929 Other chronic pain: Secondary | ICD-10-CM | POA: Diagnosis not present

## 2016-01-29 ENCOUNTER — Ambulatory Visit: Payer: BLUE CROSS/BLUE SHIELD

## 2016-01-29 DIAGNOSIS — Z803 Family history of malignant neoplasm of breast: Secondary | ICD-10-CM | POA: Diagnosis not present

## 2016-01-29 DIAGNOSIS — Z1231 Encounter for screening mammogram for malignant neoplasm of breast: Secondary | ICD-10-CM | POA: Diagnosis not present

## 2016-02-12 ENCOUNTER — Encounter: Payer: Self-pay | Admitting: Women's Health

## 2016-02-28 DIAGNOSIS — M47816 Spondylosis without myelopathy or radiculopathy, lumbar region: Secondary | ICD-10-CM | POA: Diagnosis not present

## 2016-02-28 DIAGNOSIS — G8929 Other chronic pain: Secondary | ICD-10-CM | POA: Diagnosis not present

## 2016-02-28 DIAGNOSIS — Z79891 Long term (current) use of opiate analgesic: Secondary | ICD-10-CM | POA: Diagnosis not present

## 2016-04-01 DIAGNOSIS — G8929 Other chronic pain: Secondary | ICD-10-CM | POA: Diagnosis not present

## 2016-04-01 DIAGNOSIS — M545 Low back pain: Secondary | ICD-10-CM | POA: Diagnosis not present

## 2016-04-01 DIAGNOSIS — M5416 Radiculopathy, lumbar region: Secondary | ICD-10-CM | POA: Diagnosis not present

## 2016-04-01 DIAGNOSIS — Z79891 Long term (current) use of opiate analgesic: Secondary | ICD-10-CM | POA: Diagnosis not present

## 2016-04-23 ENCOUNTER — Other Ambulatory Visit: Payer: Self-pay | Admitting: Cardiology

## 2016-04-23 DIAGNOSIS — I209 Angina pectoris, unspecified: Secondary | ICD-10-CM | POA: Diagnosis not present

## 2016-04-23 DIAGNOSIS — R079 Chest pain, unspecified: Secondary | ICD-10-CM

## 2016-04-23 DIAGNOSIS — R42 Dizziness and giddiness: Secondary | ICD-10-CM | POA: Diagnosis not present

## 2016-04-23 DIAGNOSIS — I10 Essential (primary) hypertension: Secondary | ICD-10-CM | POA: Diagnosis not present

## 2016-04-23 DIAGNOSIS — R0602 Shortness of breath: Secondary | ICD-10-CM | POA: Diagnosis not present

## 2016-04-29 ENCOUNTER — Encounter (HOSPITAL_COMMUNITY)
Admission: RE | Admit: 2016-04-29 | Discharge: 2016-04-29 | Disposition: A | Discharge status: Home / Self Care | Payer: BLUE CROSS/BLUE SHIELD | Source: Ambulatory Visit | Attending: Cardiology | Admitting: Cardiology

## 2016-04-29 DIAGNOSIS — I10 Essential (primary) hypertension: Secondary | ICD-10-CM | POA: Diagnosis not present

## 2016-04-29 DIAGNOSIS — R079 Chest pain, unspecified: Secondary | ICD-10-CM | POA: Diagnosis not present

## 2016-04-29 DIAGNOSIS — M47816 Spondylosis without myelopathy or radiculopathy, lumbar region: Secondary | ICD-10-CM | POA: Diagnosis not present

## 2016-04-29 DIAGNOSIS — M545 Low back pain: Secondary | ICD-10-CM | POA: Diagnosis not present

## 2016-04-29 DIAGNOSIS — G8929 Other chronic pain: Secondary | ICD-10-CM | POA: Diagnosis not present

## 2016-04-29 DIAGNOSIS — Z79891 Long term (current) use of opiate analgesic: Secondary | ICD-10-CM | POA: Diagnosis not present

## 2016-04-29 DIAGNOSIS — F341 Dysthymic disorder: Secondary | ICD-10-CM | POA: Diagnosis not present

## 2016-04-29 DIAGNOSIS — I209 Angina pectoris, unspecified: Secondary | ICD-10-CM | POA: Diagnosis not present

## 2016-04-29 MED ORDER — REGADENOSON 0.4 MG/5ML IV SOLN
INTRAVENOUS | Status: AC
  Administered 2016-04-29: 0.4 mg
  Filled 2016-04-29: qty 5

## 2016-04-29 MED ORDER — TECHNETIUM TC 99M TETROFOSMIN IV KIT
10.0000 | PACK | Freq: Once | INTRAVENOUS | Status: AC | PRN
  Administered 2016-04-29: 10 via INTRAVENOUS

## 2016-04-29 MED ORDER — TECHNETIUM TC 99M TETROFOSMIN IV KIT
30.0000 | PACK | Freq: Once | INTRAVENOUS | Status: AC | PRN
  Administered 2016-04-29: 30 via INTRAVENOUS

## 2016-05-26 DIAGNOSIS — G8929 Other chronic pain: Secondary | ICD-10-CM | POA: Diagnosis not present

## 2016-05-26 DIAGNOSIS — Z79891 Long term (current) use of opiate analgesic: Secondary | ICD-10-CM | POA: Diagnosis not present

## 2016-05-26 DIAGNOSIS — M545 Low back pain: Secondary | ICD-10-CM | POA: Diagnosis not present

## 2016-05-26 DIAGNOSIS — M47816 Spondylosis without myelopathy or radiculopathy, lumbar region: Secondary | ICD-10-CM | POA: Diagnosis not present

## 2016-07-14 ENCOUNTER — Emergency Department (HOSPITAL_BASED_OUTPATIENT_CLINIC_OR_DEPARTMENT_OTHER): Payer: BLUE CROSS/BLUE SHIELD

## 2016-07-14 ENCOUNTER — Emergency Department (HOSPITAL_BASED_OUTPATIENT_CLINIC_OR_DEPARTMENT_OTHER)
Admission: EM | Admit: 2016-07-14 | Discharge: 2016-07-15 | Disposition: A | Discharge status: Home / Self Care | Payer: BLUE CROSS/BLUE SHIELD | Attending: Emergency Medicine | Admitting: Emergency Medicine

## 2016-07-14 ENCOUNTER — Encounter (HOSPITAL_BASED_OUTPATIENT_CLINIC_OR_DEPARTMENT_OTHER): Payer: Self-pay | Admitting: *Deleted

## 2016-07-14 DIAGNOSIS — Z79899 Other long term (current) drug therapy: Secondary | ICD-10-CM | POA: Diagnosis not present

## 2016-07-14 DIAGNOSIS — A09 Infectious gastroenteritis and colitis, unspecified: Secondary | ICD-10-CM | POA: Diagnosis not present

## 2016-07-14 DIAGNOSIS — Z87891 Personal history of nicotine dependence: Secondary | ICD-10-CM | POA: Diagnosis not present

## 2016-07-14 DIAGNOSIS — R1031 Right lower quadrant pain: Secondary | ICD-10-CM | POA: Diagnosis not present

## 2016-07-14 DIAGNOSIS — R197 Diarrhea, unspecified: Secondary | ICD-10-CM

## 2016-07-14 DIAGNOSIS — R1011 Right upper quadrant pain: Secondary | ICD-10-CM | POA: Diagnosis not present

## 2016-07-14 HISTORY — DX: Major depressive disorder, single episode, unspecified: F32.9

## 2016-07-14 HISTORY — DX: Depression, unspecified: F32.A

## 2016-07-14 LAB — CBC WITH DIFFERENTIAL/PLATELET
BASOS ABS: 0 10*3/uL (ref 0.0–0.1)
Basophils Relative: 0 %
Eosinophils Absolute: 0 10*3/uL (ref 0.0–0.7)
Eosinophils Relative: 0 %
HEMATOCRIT: 42.8 % (ref 36.0–46.0)
HEMOGLOBIN: 13.8 g/dL (ref 12.0–15.0)
LYMPHS ABS: 2.2 10*3/uL (ref 0.7–4.0)
LYMPHS PCT: 28 %
MCH: 31.4 pg (ref 26.0–34.0)
MCHC: 32.2 g/dL (ref 30.0–36.0)
MCV: 97.5 fL (ref 78.0–100.0)
Monocytes Absolute: 0.7 10*3/uL (ref 0.1–1.0)
Monocytes Relative: 9 %
NEUTROS ABS: 5.1 10*3/uL (ref 1.7–7.7)
NEUTROS PCT: 63 %
Platelets: 384 10*3/uL (ref 150–400)
RBC: 4.39 MIL/uL (ref 3.87–5.11)
RDW: 13.2 % (ref 11.5–15.5)
WBC: 8.1 10*3/uL (ref 4.0–10.5)

## 2016-07-14 LAB — BASIC METABOLIC PANEL
ANION GAP: 9 (ref 5–15)
BUN: 11 mg/dL (ref 6–20)
CHLORIDE: 105 mmol/L (ref 101–111)
CO2: 27 mmol/L (ref 22–32)
Calcium: 9.6 mg/dL (ref 8.9–10.3)
Creatinine, Ser: 0.8 mg/dL (ref 0.44–1.00)
GFR calc Af Amer: >60 mL/min (ref >60)
GFR calc non Af Amer: >60 mL/min (ref >60)
GLUCOSE: 108 mg/dL — AB (ref 65–99)
POTASSIUM: 3.4 mmol/L — AB (ref 3.5–5.1)
Sodium: 141 mmol/L (ref 135–145)

## 2016-07-14 LAB — URINALYSIS, ROUTINE W REFLEX MICROSCOPIC
Bilirubin Urine: NEGATIVE
Glucose, UA: NEGATIVE mg/dL
HGB URINE DIPSTICK: NEGATIVE
Ketones, ur: NEGATIVE mg/dL
LEUKOCYTES UA: NEGATIVE
Nitrite: NEGATIVE
Protein, ur: NEGATIVE mg/dL
SPECIFIC GRAVITY, URINE: 1.02 (ref 1.005–1.030)
pH: 6 (ref 5.0–8.0)

## 2016-07-14 MED ORDER — ONDANSETRON HCL 4 MG/2ML IJ SOLN
4.0000 mg | Freq: Once | INTRAMUSCULAR | Status: AC
  Administered 2016-07-14: 4 mg via INTRAVENOUS
  Filled 2016-07-14: qty 2

## 2016-07-14 MED ORDER — IOPAMIDOL (ISOVUE-300) INJECTION 61%
100.0000 mL | Freq: Once | INTRAVENOUS | Status: AC | PRN
  Administered 2016-07-14: 100 mL via INTRAVENOUS

## 2016-07-14 MED ORDER — SODIUM CHLORIDE 0.9 % IV BOLUS (SEPSIS)
1000.0000 mL | Freq: Once | INTRAVENOUS | Status: AC
  Administered 2016-07-14: 1000 mL via INTRAVENOUS

## 2016-07-14 NOTE — ED Triage Notes (Addendum)
Chills, abdominal pain, body aches, nausea, diarrhea and headache x 2 days. [facility] and sent here to r/o appendicitis.

## 2016-07-14 NOTE — ED Notes (Signed)
ED Provider at bedside. 

## 2016-07-14 NOTE — ED Provider Notes (Signed)
[facility] Note   CSN: [id number] Arrival date & time: 07/14/16  1801  By signing my name below, I, Arianna Nassar, attest that this documentation has been prepared under the direction and in the presence of Nira Conn, MD.  Electronically Signed: Octavia Heir, ED Scribe. 07/14/16. 9:51 PM.    History   Chief Complaint Chief Complaint  Patient presents with  . Abdominal Pain    The history is provided by the patient. No language interpreter was used.   HPI Comments: Christina [name] is a 61 y.o. female who presents to the Emergency Department complaining of constant, gradual worsening, RLQ abdominal pain that began last night. She reports associated dark, mucus-like diarrhea x 2 days, generalized body aches, loss of appetite, chills, nausea, and subjective fever which here present prior to her abdominal pain starting. Pt was seen at [facility] for her symptoms today and was told to come to the ED to rule out appendicitis. Pt says that certain movements increases her pain and there are no modifying factors. Pt has not taken any pain medication to alleviate her abdominal pain but expresses that she has been taking imodium for her diarrhea. She has an abdominal surgical hx of a partial hysterectomy. Pt has not had any suspicious food intake in the past week or recent travel outside of the country. She does not have a hx of ulcers or ovarian cysts. She denies vomiting, dysuria, vaginal bleeding, and vaginal discharge. Pt has no known drug allergies.  Past Medical History:  Diagnosis Date  . Depression     Patient Active Problem List   Diagnosis Date Noted  . History of total vaginal hysterectomy (TVH) 12/18/2015  . Chronic back pain 03/25/2013    Past Surgical History:  Procedure Laterality Date  . CARPAL TUNNEL RELEASE Bilateral 05/21/2013   BILATERAL CARPAL TUNNEL SURGERY  . CESAREAN SECTION    . VAGINAL HYSTERECTOMY      OB History    Gravida Para  Term Preterm AB Living   4 3     1 4    SAB TAB Ectopic Multiple Live Births                   Home Medications    Prior to Admission medications   Medication Sig Start Date End Date Taking? Authorizing Provider  ondansetron (ZOFRAN ODT) 4 MG disintegrating tablet Take 1 tablet (4 mg total) by mouth every 8 (eight) hours as needed for nausea or vomiting. 07/15/16   Nira Conn, MD  sertraline (ZOLOFT) 50 MG tablet Take 1 tablet by mouth daily. 09/18/15   Historical Provider, MD  valACYclovir (VALTREX) 500 MG tablet Take twice daily for 3-5 days as needed 12/18/15   Harrington Challenger, NP  Vitamin D, Ergocalciferol, (DRISDOL) 50000 UNITS CAPS capsule Take 1 capsule (50,000 Units total) by mouth every 7 (seven) days. 10/31/14   Harrington Challenger, NP    Family History Family History  Problem Relation Age of Onset  . Cancer Mother     brain  . Ovarian cancer Mother   . Cancer Father     prostate  . Ovarian cancer Maternal Aunt   . Breast cancer Maternal Aunt   . Breast cancer Maternal Grandmother   . Ovarian cancer Maternal Grandmother     Social History Social History  Substance Use Topics  . Smoking status: Former Games developer  . Smokeless tobacco: Never Used  . Alcohol use 0.0 oz/week  Allergies   Patient has no known allergies.   Review of Systems Review of Systems  A complete 10 system review of systems was obtained and all systems are negative except as noted in the HPI and PMH.   Physical Exam Updated Vital Signs Initial vitals: BP 170/93 (BP Location: Right Arm)   Pulse 76   Temp 98.1 F (36.7 C) (Oral)   Resp 20   Ht 5\' 4"  (1.626 m)   Wt 153 lb 6 oz (69.6 kg)   SpO2 100%   BMI 26.33 kg/m   Physical Exam  Constitutional: She is oriented to person, place, and time. She appears well-developed and well-nourished. No distress.  HENT:  Head: Normocephalic and atraumatic.  Nose: Nose normal.  Eyes: Conjunctivae and EOM are normal. Pupils are equal, round, and  reactive to light. Right eye exhibits no discharge. Left eye exhibits no discharge. No scleral icterus.  Neck: Normal range of motion. Neck supple.  Cardiovascular: Normal rate and regular rhythm.  Exam reveals no gallop and no friction rub.   No murmur heard. Pulmonary/Chest: Effort normal and breath sounds normal. No stridor. No respiratory distress. She has no rales.  Abdominal: Soft. She exhibits no distension. There is tenderness. There is guarding.  RLQ tenderness with guarding, positive Psoas sign, positive obturator sign  Musculoskeletal: She exhibits no edema or tenderness.  Neurological: She is alert and oriented to person, place, and time.  Skin: Skin is warm and dry. No rash noted. She is not diaphoretic. No erythema.  Psychiatric: She has a normal mood and affect.  Vitals reviewed.    ED Treatments / Results  DIAGNOSTIC STUDIES: Oxygen Saturation is 100% on RA, normal by my interpretation.  COORDINATION OF CARE:  9:50 PM Discussed treatment plan with pt at bedside and pt agreed to plan.  Labs (all labs ordered are listed, but only abnormal results are displayed) Labs Reviewed  URINALYSIS, ROUTINE W REFLEX MICROSCOPIC - Abnormal; Notable for the following:       Result Value   APPearance CLOUDY (*)    All other components within normal limits  BASIC METABOLIC PANEL - Abnormal; Notable for the following:    Potassium 3.4 (*)    Glucose, Bld 108 (*)    All other components within normal limits  CBC WITH DIFFERENTIAL/PLATELET    EKG  EKG Interpretation None       Radiology Ct Abdomen Pelvis W Contrast  Result Date: 07/15/2016 CLINICAL DATA:  Right lower quadrant pain x2 days with chills, body aches and diarrhea. Hysterectomy. EXAM: CT ABDOMEN AND PELVIS WITH CONTRAST TECHNIQUE: Multidetector CT imaging of the abdomen and pelvis was performed using the standard protocol following bolus administration of intravenous contrast. CONTRAST:  100mL ISOVUE-300 IOPAMIDOL  (ISOVUE-300) INJECTION 61% COMPARISON:  CT from 08/04/2014 FINDINGS: Lower chest: Minimal atelectasis and/or scarring at the lung bases. Normal size cardiac chambers. Hepatobiliary: Stable benign appearing hypodense lesions of the liver consistent with cysts and/or hemangiomas. No biliary dilatation. Unremarkable gallbladder. Pancreas: Unremarkable. No pancreatic ductal dilatation or surrounding inflammatory changes. Spleen: Normal in size without focal abnormality. 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 Tiny interpolar hypodensity in the right kidney measuring 4 mm, too small to further characterize. Similar hypodense subcentimeter foci in the left kidney statistically consistent with cysts but too small to further characterize. Nonobstructing calculus in the left upper pole measuring 2 mm. Unremarkable bladder. Stomach/Bowel: Mild transmural thickening of large intestine possibly from underdistention. A colitis is not  entirely excluded. Findings are noted from ascending colon through rectum. No small bowel dilatation or inflammation. Normal-appearing appendix. Vascular/Lymphatic: No aortic aneurysm.  No lymphadenopathy. Reproductive: Hysterectomy.  Ovaries are unremarkable. Go Other: No free air nor abdominopelvic ascites. Musculoskeletal: Vertebral hemangioma at L4. No acute osseous abnormality. Lower lumbar facet arthropathy with joint space narrowing, hypertrophy and sclerosis noted from L3 through S1. IMPRESSION: Benign appearing renal and hepatic lesions without new or worrisome lesions identified. Nonobstructing left-sided renal 2 mm upper pole calculus. Thickened appearance of large bowel possibly from underdistention. A mild colitis is not entirely excluded. This may explain the patient's reported history of diarrhea. Normal-appearing appendix. Electronically Signed   By: Tollie Eth M.D.   On: 07/15/2016 00:13    Procedures Procedures (including critical care  time)  Medications Ordered in ED Medications  sodium chloride 0.9 % bolus 1,000 mL (1,000 mLs Intravenous New Bag/Given 07/14/16 2209)  ondansetron (ZOFRAN) injection 4 mg (4 mg Intravenous Given 07/14/16 2208)  iopamidol (ISOVUE-300) 61 % injection 100 mL (100 mLs Intravenous Contrast Given 07/14/16 2347)     Initial Impression / Assessment and Plan / ED Course  I have reviewed the triage vital signs and the nursing notes.  Pertinent labs & imaging results that were available during my care of the patient were reviewed by me and considered in my medical decision making (see chart for details).  Clinical Course     Labs reassuring. CT scan without evidence of appendicitis however there is mild colitis. Likely viral process. Patient is able to tolerate by mouth intake.  The patient is safe for discharge with strict return precautions.   Final Clinical Impressions(s) / ED Diagnoses   Final diagnoses:  Diarrhea of presumed infectious origin  RLQ abdominal pain   Disposition: Discharge  Condition: Good  I have discussed the results, Dx and Tx plan with the patient who expressed understanding and agree(s) with the plan. Discharge instructions discussed at great length. The patient was given strict return precautions who verbalized understanding of the instructions. No further questions at time of discharge.    New Prescriptions   ONDANSETRON (ZOFRAN ODT) 4 MG DISINTEGRATING TABLET    Take 1 tablet (4 mg total) by mouth every 8 (eight) hours as needed for nausea or vomiting.    Follow Up: Jarrett Soho, PA-C [address] [place] [address] [phone]  Schedule an appointment as soon as possible for a visit  in 5-7 days, If symptoms do not improve or  worsen     I personally performed the services described in this documentation, which was scribed in my presence. The recorded information has been reviewed and is accurate.        Nira Conn,  MD 07/15/16 [facility] Manners

## 2016-07-15 DIAGNOSIS — R1031 Right lower quadrant pain: Secondary | ICD-10-CM | POA: Diagnosis not present

## 2016-07-15 DIAGNOSIS — R197 Diarrhea, unspecified: Secondary | ICD-10-CM | POA: Diagnosis not present

## 2016-07-15 MED ORDER — ONDANSETRON 4 MG PO TBDP: 4.0000 mg | ORAL_TABLET | Freq: Three times a day (TID) | ORAL | 0 refills | Status: DC | PRN

## 2016-08-11 DIAGNOSIS — R52 Pain, unspecified: Secondary | ICD-10-CM | POA: Diagnosis not present

## 2016-08-11 DIAGNOSIS — R51 Headache: Secondary | ICD-10-CM | POA: Diagnosis not present

## 2016-08-11 DIAGNOSIS — H6692 Otitis media, unspecified, left ear: Secondary | ICD-10-CM | POA: Diagnosis not present

## 2016-08-11 DIAGNOSIS — J3489 Other specified disorders of nose and nasal sinuses: Secondary | ICD-10-CM | POA: Diagnosis not present

## 2016-08-12 ENCOUNTER — Ambulatory Visit (INDEPENDENT_AMBULATORY_CARE_PROVIDER_SITE_OTHER): Payer: BLUE CROSS/BLUE SHIELD | Admitting: Women's Health

## 2016-08-12 ENCOUNTER — Encounter: Payer: Self-pay | Admitting: Women's Health

## 2016-08-12 DIAGNOSIS — B9689 Other specified bacterial agents as the cause of diseases classified elsewhere: Secondary | ICD-10-CM

## 2016-08-12 DIAGNOSIS — N760 Acute vaginitis: Secondary | ICD-10-CM | POA: Diagnosis not present

## 2016-08-12 DIAGNOSIS — B009 Herpesviral infection, unspecified: Secondary | ICD-10-CM | POA: Diagnosis not present

## 2016-08-12 DIAGNOSIS — R300 Dysuria: Secondary | ICD-10-CM | POA: Diagnosis not present

## 2016-08-12 LAB — URINALYSIS W MICROSCOPIC + REFLEX CULTURE
Bilirubin Urine: NEGATIVE
Casts: NONE SEEN [LPF]
Glucose, UA: NEGATIVE
HGB URINE DIPSTICK: NEGATIVE
Ketones, ur: NEGATIVE
Leukocytes, UA: NEGATIVE
Nitrite: NEGATIVE
PROTEIN: NEGATIVE
RBC / HPF: NONE SEEN RBC/HPF (ref <=2)
Specific Gravity, Urine: 1.025 (ref 1.001–1.035)
YEAST: NONE SEEN [HPF]
pH: 6 (ref 5.0–8.0)

## 2016-08-12 MED ORDER — METRONIDAZOLE 500 MG PO TABS: 500.0000 mg | ORAL_TABLET | Freq: Two times a day (BID) | ORAL | 0 refills | Status: DC

## 2016-08-12 MED ORDER — ACYCLOVIR 5 % EX OINT: 1.0000 "application " | TOPICAL_OINTMENT | CUTANEOUS | 1 refills | Status: DC

## 2016-08-12 MED ORDER — FLUCONAZOLE 150 MG PO TABS: 150.0000 mg | ORAL_TABLET | Freq: Once | ORAL | 1 refills | Status: AC

## 2016-08-12 MED ORDER — VALACYCLOVIR HCL 500 MG PO TABS: ORAL_TABLET | ORAL | 12 refills | Status: DC

## 2016-08-12 NOTE — Progress Notes (Signed)
Presents with complaint of vaginal pain/burning sensation and generally not feeling well.  History of rare HSV on her buttocks but has a new outbreak near vaginal introitus. Has taken Valtrex with some relief. Currently being treated for upper respiratory infection with amoxicillin per primary care. Has been seen by primary care 3 times in the past month. History of chronic back pain had been on Percocet in the past per primary care who has retired. States has back pain daily requesting referral to pain management. Also has appointment for acupuncture at the end of the month. States burning discomfort with urination, no pain at end of stream. Denies fever, vaginal discharge or  nausea. Hysterectomy for endometriosis and DUB on no HRT.  Exam: Appears uncomfortable. No CVAT, abdomen soft without rebound or radiation of pain. External genitalia vaginal introitus erythematous, 3 small ulcerated areas at. posterior fornix.  UA negative, 0-5 WBCs, 20-40 squamous epithelials, many bacteria, positive clue cells.  HSV Bacteria vaginosis Chronic back pain   Plan: Referral to pain clinic. Continue Valtrex 500 twice daily for 5 days. Zovirax ointment 3 times daily as needed, Flagyl 500 twice daily for 7 days. Alcohol precautions reviewed. Instructed to call if continued problems. Urine culture pending.

## 2016-08-12 NOTE — Patient Instructions (Signed)

## 2016-08-13 ENCOUNTER — Telehealth: Payer: Self-pay | Admitting: *Deleted

## 2016-08-13 LAB — URINE CULTURE: ORGANISM ID, BACTERIA: NO GROWTH

## 2016-08-13 NOTE — Telephone Encounter (Signed)
-----   Message from Harrington ChallengerNancy J Young, NP sent at 08/12/2016 10:44 AM EST ----- Please refer to pain managemtn, chronic back pain

## 2016-08-13 NOTE — Telephone Encounter (Signed)
Late entry 08/12/16) I called [facility] and was informed that they will need office note, any imaging etc before referral can be made. I called patient and relayed this to her as we have none of the above, pt said she has imaging but no office note. Pt doctor who was managing her back pain practice has closed per patient. Pt said she was going to check on some things and call me back.

## 2016-11-12 ENCOUNTER — Encounter: Payer: Self-pay | Admitting: Gynecology

## 2016-11-14 DIAGNOSIS — J3489 Other specified disorders of nose and nasal sinuses: Secondary | ICD-10-CM | POA: Diagnosis not present

## 2016-11-14 DIAGNOSIS — R51 Headache: Secondary | ICD-10-CM | POA: Diagnosis not present

## 2016-11-14 DIAGNOSIS — R5383 Other fatigue: Secondary | ICD-10-CM | POA: Diagnosis not present

## 2016-11-14 DIAGNOSIS — R52 Pain, unspecified: Secondary | ICD-10-CM | POA: Diagnosis not present

## 2016-12-16 DIAGNOSIS — R1013 Epigastric pain: Secondary | ICD-10-CM | POA: Diagnosis not present

## 2016-12-18 DIAGNOSIS — F4323 Adjustment disorder with mixed anxiety and depressed mood: Secondary | ICD-10-CM | POA: Diagnosis not present

## 2016-12-23 DIAGNOSIS — M5442 Lumbago with sciatica, left side: Secondary | ICD-10-CM | POA: Diagnosis not present

## 2016-12-23 DIAGNOSIS — M533 Sacrococcygeal disorders, not elsewhere classified: Secondary | ICD-10-CM | POA: Diagnosis not present

## 2016-12-23 DIAGNOSIS — G8929 Other chronic pain: Secondary | ICD-10-CM | POA: Diagnosis not present

## 2016-12-25 DIAGNOSIS — F4323 Adjustment disorder with mixed anxiety and depressed mood: Secondary | ICD-10-CM | POA: Diagnosis not present

## 2016-12-29 ENCOUNTER — Other Ambulatory Visit: Payer: Self-pay | Admitting: Orthopedic Surgery

## 2016-12-29 DIAGNOSIS — M5442 Lumbago with sciatica, left side: Principal | ICD-10-CM

## 2016-12-29 DIAGNOSIS — G8929 Other chronic pain: Secondary | ICD-10-CM

## 2016-12-30 ENCOUNTER — Ambulatory Visit (INDEPENDENT_AMBULATORY_CARE_PROVIDER_SITE_OTHER): Payer: BLUE CROSS/BLUE SHIELD | Admitting: Women's Health

## 2016-12-30 ENCOUNTER — Encounter: Payer: Self-pay | Admitting: Women's Health

## 2016-12-30 VITALS — BP 110/80 | Ht 64.0 in | Wt 152.0 lb

## 2016-12-30 DIAGNOSIS — Z13820 Encounter for screening for osteoporosis: Secondary | ICD-10-CM

## 2016-12-30 DIAGNOSIS — Z13220 Encounter for screening for lipoid disorders: Secondary | ICD-10-CM

## 2016-12-30 DIAGNOSIS — B009 Herpesviral infection, unspecified: Secondary | ICD-10-CM | POA: Diagnosis not present

## 2016-12-30 DIAGNOSIS — E559 Vitamin D deficiency, unspecified: Secondary | ICD-10-CM | POA: Diagnosis not present

## 2016-12-30 DIAGNOSIS — M5440 Lumbago with sciatica, unspecified side: Secondary | ICD-10-CM | POA: Diagnosis not present

## 2016-12-30 DIAGNOSIS — Z01419 Encounter for gynecological examination (general) (routine) without abnormal findings: Secondary | ICD-10-CM | POA: Diagnosis not present

## 2016-12-30 LAB — COMPREHENSIVE METABOLIC PANEL
ALK PHOS: 69 U/L (ref 33–130)
ALT: 17 U/L (ref 6–29)
AST: 16 U/L (ref 10–35)
Albumin: 4.4 g/dL (ref 3.6–5.1)
BUN: 19 mg/dL (ref 7–25)
CO2: 23 mmol/L (ref 20–31)
Calcium: 9.4 mg/dL (ref 8.6–10.4)
Chloride: 105 mmol/L (ref 98–110)
Creat: 0.88 mg/dL (ref 0.50–1.05)
GLUCOSE: 86 mg/dL (ref 65–99)
POTASSIUM: 4 mmol/L (ref 3.5–5.3)
Sodium: 140 mmol/L (ref 135–146)
Total Bilirubin: 0.6 mg/dL (ref 0.2–1.2)
Total Protein: 7 g/dL (ref 6.1–8.1)

## 2016-12-30 LAB — CBC WITH DIFFERENTIAL/PLATELET
BASOS PCT: 0 %
Basophils Absolute: 0 cells/uL (ref 0–200)
Eosinophils Absolute: 82 cells/uL (ref 15–500)
Eosinophils Relative: 1 %
HCT: 42.5 % (ref 35.0–45.0)
Hemoglobin: 13.9 g/dL (ref 11.7–15.5)
LYMPHS PCT: 19 %
Lymphs Abs: 1558 cells/uL (ref 850–3900)
MCH: 32.9 pg (ref 27.0–33.0)
MCHC: 32.7 g/dL (ref 32.0–36.0)
MCV: 100.5 fL — AB (ref 80.0–100.0)
MONOS PCT: 10 %
MPV: 9.4 fL (ref 7.5–12.5)
Monocytes Absolute: 820 cells/uL (ref 200–950)
Neutro Abs: 5740 cells/uL (ref 1500–7800)
Neutrophils Relative %: 70 %
PLATELETS: 334 10*3/uL (ref 140–400)
RBC: 4.23 MIL/uL (ref 3.80–5.10)
RDW: 14.1 % (ref 11.0–15.0)
WBC: 8.2 10*3/uL (ref 3.8–10.8)

## 2016-12-30 LAB — LIPID PANEL
CHOL/HDL RATIO: 1.9 ratio (ref <5.0)
Cholesterol: 243 mg/dL — ABNORMAL HIGH (ref <200)
HDL: 125 mg/dL (ref >50)
LDL Cholesterol: 100 mg/dL — ABNORMAL HIGH (ref <100)
Triglycerides: 89 mg/dL (ref <150)
VLDL: 18 mg/dL (ref <30)

## 2016-12-30 MED ORDER — OXYCODONE HCL 5 MG PO TABS: 5.0000 mg | ORAL_TABLET | ORAL | 0 refills | Status: DC | PRN

## 2016-12-30 MED ORDER — CYCLOBENZAPRINE HCL 10 MG PO TABS
10.0000 mg | ORAL_TABLET | Freq: Three times a day (TID) | ORAL | 0 refills | Status: DC | PRN
Start: 2016-12-30 — End: 2018-05-10

## 2016-12-30 MED ORDER — VALACYCLOVIR HCL 500 MG PO TABS: ORAL_TABLET | ORAL | 12 refills | Status: DC

## 2016-12-30 NOTE — Progress Notes (Signed)
Christina [name] 03/02/1956 [id number]    History:    Presents for annual exam.  2006 TVH for endometriosis and DUB. Abnormal Pap greater than 20 years ago with normal Paps after. Normal mammogram history. 2015 negative colonoscopy. 2016 T score -2.1 FRAX 9.2% /1.3%. Struggles with depression and is seeing a therapist. Chronic back pain and has follow-up with pain management 01/14/2017, pulled a muscle in her back last week and has not been able to sleep, minimal relief with over-the-counter pain medication and heat, requesting muscle relaxant and pain medication. History of chronic pain has been off all pain medication since December was hoping to be able to live without pain medication. Has degenerative disc disease but states this pain is different happened with moving boxes. HSV rare outbreaks.  Past medical history, past surgical history, family history and social history were all reviewed and documented in the EPIC chart. 4 children, 4 grandchildren. Daughter recently had a stillborn at 3139 weeks cord accident.  ROS:  A ROS was performed and pertinent positives and negatives are included.  Exam:  Vitals:   12/30/16 1414  BP: 110/80  Weight: 152 lb (68.9 kg)  Height: 5\' 4"  (1.626 m)   Body mass index is 26.09 kg/m.   General appearance:  Normal Thyroid:  Symmetrical, normal in size, without palpable masses or nodularity. Respiratory  Auscultation:  Clear without wheezing or rhonchi Cardiovascular  Auscultation:  Regular rate, without rubs, murmurs or gallops  Edema/varicosities:  Not grossly evident Abdominal  Soft,nontender, without masses, guarding or rebound.  Liver/spleen:  No organomegaly noted  Hernia:  None appreciated  Skin  Inspection:  Grossly normal   Breasts: Examined lying and sitting.     Right: Without masses, retractions, discharge or axillary adenopathy.     Left: Without masses, retractions, discharge or axillary adenopathy. Gentitourinary   Inguinal/mons:   Normal without inguinal adenopathy  External genitalia:  Normal  BUS/Urethra/Skene's glands:  Normal  Vagina:  Atrophic  Cervix: and uterus absent  Adnexa/parametria:     Rt: Without masses or tenderness.   Lt: Without masses or tenderness.  Anus and perineum: Normal  Digital rectal exam: Normal sphincter tone without palpated masses or tenderness  Assessment/Plan:  61 y.o. M WF G4 P4 for annual exam with complaint of acute low back muscle strain 3 days.  TVH on no HRT-vaginal atrophy HSV rare outbreaks Depression-therapy Osteopenia without elevated FRAX. Chronic back pain-follow-up pain management on 01/14/2017  Plan: States unable to get an appointment earlier at pain management, [place]. Oxycodone 5 mg every 8 hours when necessary #15 no refills. Aware of addictive, constipating properties,. Flexeril 10 mg will start with half tablet. Continue heat and rest. SBE's, continue annual screening mammogram due next month. Exercise as able, calcium rich diet, vitamin D 2000 daily, home safety, fall prevention and importance of weightbearing exercise reviewed. Repeat DEXA. CBC, CMP, lipid panel, vitamin D, Condolences given, daughter Toni AmendCourtney had a stillbirth at 4239 weeks  May 2018.    Harrington Challengerancy J Tamaj Jurgens [facility]NP, 3:16 PM 12/30/2016

## 2016-12-30 NOTE — Patient Instructions (Signed)
Health Maintenance for Postmenopausal Women Menopause is a normal process in which your reproductive ability comes to an end. This process happens gradually over a span of months to years, usually between the ages of 83 and 41. Menopause is complete when you have missed 12 consecutive menstrual periods. It is important to talk with your health care provider about some of the most common conditions that affect postmenopausal women, such as heart disease, cancer, and bone loss (osteoporosis). Adopting a healthy lifestyle and getting preventive care can help to promote your health and wellness. Those actions can also lower your chances of developing some of these common conditions. What should I know about menopause? During menopause, you may experience a number of symptoms, such as:  Moderate-to-severe hot flashes.  Night sweats.  Decrease in sex drive.  Mood swings.  Headaches.  Tiredness.  Irritability.  Memory problems.  Insomnia.  Choosing to treat or not to treat menopausal changes is an individual decision that you make with your health care provider. What should I know about hormone replacement therapy and supplements? Hormone therapy products are effective for treating symptoms that are associated with menopause, such as hot flashes and night sweats. Hormone replacement carries certain risks, especially as you become older. If you are thinking about using estrogen or estrogen with progestin treatments, discuss the benefits and risks with your health care provider. What should I know about heart disease and stroke? Heart disease, heart attack, and stroke become more likely as you age. This may be due, in part, to the hormonal changes that your body experiences during menopause. These can affect how your body processes dietary fats, triglycerides, and cholesterol. Heart attack and stroke are both medical emergencies. There are many things that you can do to help prevent heart  disease and stroke:  Have your blood pressure checked at least every 1-2 years. High blood pressure causes heart disease and increases the risk of stroke.  If you are 12-82 years old, ask your health care provider if you should take aspirin to prevent a heart attack or a stroke.  Do not use any tobacco products, including cigarettes, chewing tobacco, or electronic cigarettes. If you need help quitting, ask your health care provider.  It is important to eat a healthy diet and maintain a healthy weight. ? Be sure to include plenty of vegetables, fruits, low-fat dairy products, and lean protein. ? Avoid eating foods that are high in solid fats, added sugars, or salt (sodium).  Get regular exercise. This is one of the most important things that you can do for your health. ? Try to exercise for at least 150 minutes each week. The type of exercise that you do should increase your heart rate and make you sweat. This is known as moderate-intensity exercise. ? Try to do strengthening exercises at least twice each week. Do these in addition to the moderate-intensity exercise.  Know your numbers.Ask your health care provider to check your cholesterol and your blood glucose. Continue to have your blood tested as directed by your health care provider.  What should I know about cancer screening? There are several types of cancer. Take the following steps to reduce your risk and to catch any cancer development as early as possible. Breast Cancer  Practice breast self-awareness. ? This means understanding how your breasts normally appear and feel. ? It also means doing regular breast self-exams. Let your health care provider know about any changes, no matter how small.  If you are 40  or older, have a clinician do a breast exam (clinical breast exam or CBE) every year. Depending on your age, family history, and medical history, it may be recommended that you also have a yearly breast X-ray  (mammogram).  If you have a family history of breast cancer, talk with your health care provider about genetic screening.  If you are at high risk for breast cancer, talk with your health care provider about having an MRI and a mammogram every year.  Breast cancer (BRCA) gene test is recommended for women who have family members with BRCA-related cancers. Results of the assessment will determine the need for genetic counseling and BRCA1 and for BRCA2 testing. BRCA-related cancers include these types: ? Breast. This occurs in males or females. ? Ovarian. ? Tubal. This may also be called fallopian tube cancer. ? Cancer of the abdominal or pelvic lining (peritoneal cancer). ? Prostate. ? Pancreatic.  Cervical, Uterine, and Ovarian Cancer Your health care provider may recommend that you be screened regularly for cancer of the pelvic organs. These include your ovaries, uterus, and vagina. This screening involves a pelvic exam, which includes checking for microscopic changes to the surface of your cervix (Pap test).  For women ages 21-65, health care providers may recommend a pelvic exam and a Pap test every three years. For women ages 72-65, they may recommend the Pap test and pelvic exam, combined with testing for human papilloma virus (HPV), every five years. Some types of HPV increase your risk of cervical cancer. Testing for HPV may also be done on women of any age who have unclear Pap test results.  Other health care providers may not recommend any screening for nonpregnant women who are considered low risk for pelvic cancer and have no symptoms. Ask your health care provider if a screening pelvic exam is right for you.  If you have had past treatment for cervical cancer or a condition that could lead to cancer, you need Pap tests and screening for cancer for at least 20 years after your treatment. If Pap tests have been discontinued for you, your risk factors (such as having a new sexual  partner) need to be reassessed to determine if you should start having screenings again. Some women have medical problems that increase the chance of getting cervical cancer. In these cases, your health care provider may recommend that you have screening and Pap tests more often.  If you have a family history of uterine cancer or ovarian cancer, talk with your health care provider about genetic screening.  If you have vaginal bleeding after reaching menopause, tell your health care provider.  There are currently no reliable tests available to screen for ovarian cancer.  Lung Cancer Lung cancer screening is recommended for adults 65-82 years old who are at high risk for lung cancer because of a history of smoking. A yearly low-dose CT scan of the lungs is recommended if you:  Currently smoke.  Have a history of at least 30 pack-years of smoking and you currently smoke or have quit within the past 15 years. A pack-year is smoking an average of one pack of cigarettes per day for one year.  Yearly screening should:  Continue until it has been 15 years since you quit.  Stop if you develop a health problem that would prevent you from having lung cancer treatment.  Colorectal Cancer  This type of cancer can be detected and can often be prevented.  Routine colorectal cancer screening usually begins at  age 30 and continues through age 22.  If you have risk factors for colon cancer, your health care provider may recommend that you be screened at an earlier age.  If you have a family history of colorectal cancer, talk with your health care provider about genetic screening.  Your health care provider may also recommend using home test kits to check for hidden blood in your stool.  A small camera at the end of a tube can be used to examine your colon directly (sigmoidoscopy or colonoscopy). This is done to check for the earliest forms of colorectal cancer.  Direct examination of the colon  should be repeated every 5-10 years until age 28. However, if early forms of precancerous polyps or small growths are found or if you have a family history or genetic risk for colorectal cancer, you may need to be screened more often.  Skin Cancer  Check your skin from head to toe regularly.  Monitor any moles. Be sure to tell your health care provider: ? About any new moles or changes in moles, especially if there is a change in a mole's shape or color. ? If you have a mole that is larger than the size of a pencil eraser.  If any of your family members has a history of skin cancer, especially at a young age, talk with your health care provider about genetic screening.  Always use sunscreen. Apply sunscreen liberally and repeatedly throughout the day.  Whenever you are outside, protect yourself by wearing long sleeves, pants, a wide-brimmed hat, and sunglasses.  What should I know about osteoporosis? Osteoporosis is a condition in which bone destruction happens more quickly than new bone creation. After menopause, you may be at an increased risk for osteoporosis. To help prevent osteoporosis or the bone fractures that can happen because of osteoporosis, the following is recommended:  If you are 62-69 years old, get at least 1,000 mg of calcium and at least 600 mg of vitamin D per day.  If you are older than age 60 but younger than age 68, get at least 1,200 mg of calcium and at least 600 mg of vitamin D per day.  If you are older than age 35, get at least 1,200 mg of calcium and at least 800 mg of vitamin D per day.  Smoking and excessive alcohol intake increase the risk of osteoporosis. Eat foods that are rich in calcium and vitamin D, and do weight-bearing exercises several times each week as directed by your health care provider. What should I know about how menopause affects my mental health? Depression may occur at any age, but it is more common as you become older. Common symptoms of  depression include:  Low or sad mood.  Changes in sleep patterns.  Changes in appetite or eating patterns.  Feeling an overall lack of motivation or enjoyment of activities that you previously enjoyed.  Frequent crying spells.  Talk with your health care provider if you think that you are experiencing depression. What should I know about immunizations? It is important that you get and maintain your immunizations. These include:  Tetanus, diphtheria, and pertussis (Tdap) booster vaccine.  Influenza every year before the flu season begins.  Pneumonia vaccine.  Shingles vaccine.  Your health care provider may also recommend other immunizations. This information is not intended to replace advice given to you by your health care provider. Make sure you discuss any questions you have with your health care provider. Document Released: 08/08/2005  Document Revised: 01/04/2016 Document Reviewed: 03/20/2015 [employer] Interactive Patient Education  2018 [employer]  

## 2016-12-31 LAB — VITAMIN D 25 HYDROXY (VIT D DEFICIENCY, FRACTURES): Vit D, 25-Hydroxy: 28 ng/mL — ABNORMAL LOW (ref 30–100)

## 2017-01-02 ENCOUNTER — Ambulatory Visit
Admission: RE | Admit: 2017-01-02 | Discharge: 2017-01-02 | Disposition: A | Discharge status: Home / Self Care | Payer: BLUE CROSS/BLUE SHIELD | Source: Ambulatory Visit | Attending: Orthopedic Surgery | Admitting: Orthopedic Surgery

## 2017-01-02 DIAGNOSIS — M533 Sacrococcygeal disorders, not elsewhere classified: Secondary | ICD-10-CM | POA: Diagnosis not present

## 2017-01-02 DIAGNOSIS — M5442 Lumbago with sciatica, left side: Principal | ICD-10-CM

## 2017-01-02 DIAGNOSIS — G8929 Other chronic pain: Secondary | ICD-10-CM

## 2017-01-02 MED ORDER — METHYLPREDNISOLONE ACETATE 40 MG/ML INJ SUSP (RADIOLOG: 120.0000 mg | Freq: Once | INTRAMUSCULAR | Status: DC

## 2017-01-21 DIAGNOSIS — R110 Nausea: Secondary | ICD-10-CM | POA: Diagnosis not present

## 2017-01-22 DIAGNOSIS — M533 Sacrococcygeal disorders, not elsewhere classified: Secondary | ICD-10-CM | POA: Diagnosis not present

## 2017-01-22 DIAGNOSIS — M5442 Lumbago with sciatica, left side: Secondary | ICD-10-CM | POA: Diagnosis not present

## 2017-01-22 DIAGNOSIS — G8929 Other chronic pain: Secondary | ICD-10-CM | POA: Diagnosis not present

## 2017-01-28 DIAGNOSIS — M5136 Other intervertebral disc degeneration, lumbar region: Secondary | ICD-10-CM | POA: Diagnosis not present

## 2017-01-28 DIAGNOSIS — Z79899 Other long term (current) drug therapy: Secondary | ICD-10-CM | POA: Diagnosis not present

## 2017-01-28 DIAGNOSIS — M545 Low back pain: Secondary | ICD-10-CM | POA: Diagnosis not present

## 2017-01-28 DIAGNOSIS — G8929 Other chronic pain: Secondary | ICD-10-CM | POA: Diagnosis not present

## 2017-01-28 DIAGNOSIS — M533 Sacrococcygeal disorders, not elsewhere classified: Secondary | ICD-10-CM | POA: Diagnosis not present

## 2017-02-03 DIAGNOSIS — G8929 Other chronic pain: Secondary | ICD-10-CM | POA: Diagnosis not present

## 2017-02-03 DIAGNOSIS — M545 Low back pain: Secondary | ICD-10-CM | POA: Diagnosis not present

## 2017-02-03 DIAGNOSIS — Z79899 Other long term (current) drug therapy: Secondary | ICD-10-CM | POA: Diagnosis not present

## 2017-02-03 DIAGNOSIS — M129 Arthropathy, unspecified: Secondary | ICD-10-CM | POA: Diagnosis not present

## 2017-02-05 DIAGNOSIS — M533 Sacrococcygeal disorders, not elsewhere classified: Secondary | ICD-10-CM | POA: Diagnosis not present

## 2017-02-09 DIAGNOSIS — M533 Sacrococcygeal disorders, not elsewhere classified: Secondary | ICD-10-CM | POA: Diagnosis not present

## 2017-02-10 DIAGNOSIS — M533 Sacrococcygeal disorders, not elsewhere classified: Secondary | ICD-10-CM | POA: Diagnosis not present

## 2017-02-10 DIAGNOSIS — G8929 Other chronic pain: Secondary | ICD-10-CM | POA: Diagnosis not present

## 2017-02-10 DIAGNOSIS — M5442 Lumbago with sciatica, left side: Secondary | ICD-10-CM | POA: Diagnosis not present

## 2017-02-12 DIAGNOSIS — M533 Sacrococcygeal disorders, not elsewhere classified: Secondary | ICD-10-CM | POA: Diagnosis not present

## 2017-03-04 DIAGNOSIS — M791 Myalgia: Secondary | ICD-10-CM | POA: Diagnosis not present

## 2017-03-04 DIAGNOSIS — G894 Chronic pain syndrome: Secondary | ICD-10-CM | POA: Diagnosis not present

## 2017-03-04 DIAGNOSIS — M545 Low back pain: Secondary | ICD-10-CM | POA: Diagnosis not present

## 2017-03-04 DIAGNOSIS — S336XXS Sprain of sacroiliac joint, sequela: Secondary | ICD-10-CM | POA: Diagnosis not present

## 2017-03-30 DIAGNOSIS — M545 Low back pain: Secondary | ICD-10-CM | POA: Diagnosis not present

## 2017-03-30 DIAGNOSIS — G894 Chronic pain syndrome: Secondary | ICD-10-CM | POA: Diagnosis not present

## 2017-03-30 DIAGNOSIS — Z79891 Long term (current) use of opiate analgesic: Secondary | ICD-10-CM | POA: Diagnosis not present

## 2017-03-30 DIAGNOSIS — S336XXS Sprain of sacroiliac joint, sequela: Secondary | ICD-10-CM | POA: Diagnosis not present

## 2017-04-03 DIAGNOSIS — M791 Myalgia, unspecified site: Secondary | ICD-10-CM | POA: Diagnosis not present

## 2017-04-27 DIAGNOSIS — G894 Chronic pain syndrome: Secondary | ICD-10-CM | POA: Diagnosis not present

## 2017-04-27 DIAGNOSIS — S336XXS Sprain of sacroiliac joint, sequela: Secondary | ICD-10-CM | POA: Diagnosis not present

## 2017-04-27 DIAGNOSIS — M545 Low back pain: Secondary | ICD-10-CM | POA: Diagnosis not present

## 2017-04-27 DIAGNOSIS — Z79891 Long term (current) use of opiate analgesic: Secondary | ICD-10-CM | POA: Diagnosis not present

## 2017-05-04 DIAGNOSIS — M791 Myalgia, unspecified site: Secondary | ICD-10-CM | POA: Diagnosis not present

## 2017-05-28 DIAGNOSIS — G894 Chronic pain syndrome: Secondary | ICD-10-CM | POA: Diagnosis not present

## 2017-05-28 DIAGNOSIS — Z79891 Long term (current) use of opiate analgesic: Secondary | ICD-10-CM | POA: Diagnosis not present

## 2017-05-28 DIAGNOSIS — S336XXS Sprain of sacroiliac joint, sequela: Secondary | ICD-10-CM | POA: Diagnosis not present

## 2017-05-28 DIAGNOSIS — M545 Low back pain: Secondary | ICD-10-CM | POA: Diagnosis not present

## 2017-06-03 DIAGNOSIS — M791 Myalgia, unspecified site: Secondary | ICD-10-CM | POA: Diagnosis not present

## 2017-07-04 DIAGNOSIS — M791 Myalgia, unspecified site: Secondary | ICD-10-CM | POA: Diagnosis not present

## 2017-07-06 DIAGNOSIS — S336XXS Sprain of sacroiliac joint, sequela: Secondary | ICD-10-CM | POA: Diagnosis not present

## 2017-07-06 DIAGNOSIS — Z79891 Long term (current) use of opiate analgesic: Secondary | ICD-10-CM | POA: Diagnosis not present

## 2017-07-06 DIAGNOSIS — M545 Low back pain: Secondary | ICD-10-CM | POA: Diagnosis not present

## 2017-07-06 DIAGNOSIS — G894 Chronic pain syndrome: Secondary | ICD-10-CM | POA: Diagnosis not present

## 2017-08-10 DIAGNOSIS — R110 Nausea: Secondary | ICD-10-CM | POA: Diagnosis not present

## 2017-08-10 DIAGNOSIS — R1013 Epigastric pain: Secondary | ICD-10-CM | POA: Diagnosis not present

## 2017-08-12 ENCOUNTER — Encounter: Payer: Self-pay | Admitting: Women's Health

## 2017-08-12 DIAGNOSIS — Z1231 Encounter for screening mammogram for malignant neoplasm of breast: Secondary | ICD-10-CM | POA: Diagnosis not present

## 2017-08-17 ENCOUNTER — Ambulatory Visit: Payer: BLUE CROSS/BLUE SHIELD | Admitting: Women's Health

## 2017-09-23 DIAGNOSIS — R1013 Epigastric pain: Secondary | ICD-10-CM | POA: Diagnosis not present

## 2017-09-23 DIAGNOSIS — K2930 Chronic superficial gastritis without bleeding: Secondary | ICD-10-CM | POA: Diagnosis not present

## 2017-09-23 DIAGNOSIS — K3189 Other diseases of stomach and duodenum: Secondary | ICD-10-CM | POA: Diagnosis not present

## 2017-09-30 DIAGNOSIS — K2930 Chronic superficial gastritis without bleeding: Secondary | ICD-10-CM | POA: Diagnosis not present

## 2017-11-04 ENCOUNTER — Other Ambulatory Visit: Payer: Self-pay | Admitting: Physician Assistant

## 2017-11-04 DIAGNOSIS — K2950 Unspecified chronic gastritis without bleeding: Secondary | ICD-10-CM | POA: Diagnosis not present

## 2017-11-04 DIAGNOSIS — R1013 Epigastric pain: Secondary | ICD-10-CM

## 2017-11-04 DIAGNOSIS — R112 Nausea with vomiting, unspecified: Secondary | ICD-10-CM | POA: Diagnosis not present

## 2017-11-04 DIAGNOSIS — R197 Diarrhea, unspecified: Secondary | ICD-10-CM | POA: Diagnosis not present

## 2017-11-06 ENCOUNTER — Ambulatory Visit
Admission: RE | Admit: 2017-11-06 | Discharge: 2017-11-06 | Disposition: A | Discharge status: Home / Self Care | Payer: BLUE CROSS/BLUE SHIELD | Source: Ambulatory Visit | Attending: Physician Assistant | Admitting: Physician Assistant

## 2017-11-06 DIAGNOSIS — R1013 Epigastric pain: Secondary | ICD-10-CM | POA: Diagnosis not present

## 2017-11-06 DIAGNOSIS — R112 Nausea with vomiting, unspecified: Secondary | ICD-10-CM

## 2017-12-02 DIAGNOSIS — R112 Nausea with vomiting, unspecified: Secondary | ICD-10-CM | POA: Diagnosis not present

## 2017-12-02 DIAGNOSIS — Z86010 Personal history of colonic polyps: Secondary | ICD-10-CM | POA: Diagnosis not present

## 2017-12-02 DIAGNOSIS — K2950 Unspecified chronic gastritis without bleeding: Secondary | ICD-10-CM | POA: Diagnosis not present

## 2017-12-02 DIAGNOSIS — R197 Diarrhea, unspecified: Secondary | ICD-10-CM | POA: Diagnosis not present

## 2017-12-09 DIAGNOSIS — R197 Diarrhea, unspecified: Secondary | ICD-10-CM | POA: Diagnosis not present

## 2017-12-11 DIAGNOSIS — R197 Diarrhea, unspecified: Secondary | ICD-10-CM | POA: Diagnosis not present

## 2018-01-06 ENCOUNTER — Ambulatory Visit: Payer: BLUE CROSS/BLUE SHIELD | Admitting: Women's Health

## 2018-01-06 ENCOUNTER — Encounter: Payer: Self-pay | Admitting: Women's Health

## 2018-01-06 VITALS — BP 128/84 | Ht 64.3 in | Wt 149.8 lb

## 2018-01-06 DIAGNOSIS — Z01419 Encounter for gynecological examination (general) (routine) without abnormal findings: Secondary | ICD-10-CM

## 2018-01-06 DIAGNOSIS — Z13820 Encounter for screening for osteoporosis: Secondary | ICD-10-CM | POA: Diagnosis not present

## 2018-01-06 DIAGNOSIS — R5383 Other fatigue: Secondary | ICD-10-CM

## 2018-01-06 DIAGNOSIS — B009 Herpesviral infection, unspecified: Secondary | ICD-10-CM | POA: Diagnosis not present

## 2018-01-06 LAB — TSH: TSH: 2.92 m[IU]/L (ref 0.40–4.50)

## 2018-01-06 MED ORDER — VALACYCLOVIR HCL 500 MG PO TABS: ORAL_TABLET | ORAL | 12 refills | Status: DC

## 2018-01-06 MED ORDER — FLUOXETINE HCL 10 MG PO CAPS: 10.0000 mg | ORAL_CAPSULE | Freq: Every day | ORAL | 2 refills | Status: DC

## 2018-01-06 NOTE — Patient Instructions (Addendum)
Health Maintenance for Postmenopausal Women Menopause is a normal process in which your reproductive ability comes to an end. This process happens gradually over a span of months to years, usually between the ages of 22 and 9. Menopause is complete when you have missed 12 consecutive menstrual periods. It is important to talk with your health care provider about some of the most common conditions that affect postmenopausal women, such as heart disease, cancer, and bone loss (osteoporosis). Adopting a healthy lifestyle and getting preventive care can help to promote your health and wellness. Those actions can also lower your chances of developing some of these common conditions. What should I know about menopause? During menopause, you may experience a number of symptoms, such as:  Moderate-to-severe hot flashes.  Night sweats.  Decrease in sex drive.  Mood swings.  Headaches.  Tiredness.  Irritability.  Memory problems.  Insomnia.  Choosing to treat or not to treat menopausal changes is an individual decision that you make with your health care provider. What should I know about hormone replacement therapy and supplements? Hormone therapy products are effective for treating symptoms that are associated with menopause, such as hot flashes and night sweats. Hormone replacement carries certain risks, especially as you become older. If you are thinking about using estrogen or estrogen with progestin treatments, discuss the benefits and risks with your health care provider. What should I know about heart disease and stroke? Heart disease, heart attack, and stroke become more likely as you age. This may be due, in part, to the hormonal changes that your body experiences during menopause. These can affect how your body processes dietary fats, triglycerides, and cholesterol. Heart attack and stroke are both medical emergencies. There are many things that you can do to help prevent heart disease  and stroke:  Have your blood pressure checked at least every 1-2 years. High blood pressure causes heart disease and increases the risk of stroke.  If you are 53-22 years old, ask your health care provider if you should take aspirin to prevent a heart attack or a stroke.  Do not use any tobacco products, including cigarettes, chewing tobacco, or electronic cigarettes. If you need help quitting, ask your health care provider.  It is important to eat a healthy diet and maintain a healthy weight. ? Be sure to include plenty of vegetables, fruits, low-fat dairy products, and lean protein. ? Avoid eating foods that are high in solid fats, added sugars, or salt (sodium).  Get regular exercise. This is one of the most important things that you can do for your health. ? Try to exercise for at least 150 minutes each week. The type of exercise that you do should increase your heart rate and make you sweat. This is known as moderate-intensity exercise. ? Try to do strengthening exercises at least twice each week. Do these in addition to the moderate-intensity exercise.  Know your numbers.Ask your health care provider to check your cholesterol and your blood glucose. Continue to have your blood tested as directed by your health care provider.  What should I know about cancer screening? There are several types of cancer. Take the following steps to reduce your risk and to catch any cancer development as early as possible. Breast Cancer  Practice breast self-awareness. ? This means understanding how your breasts normally appear and feel. ? It also means doing regular breast self-exams. Let your health care provider know about any changes, no matter how small.  If you are 40  or older, have a clinician do a breast exam (clinical breast exam or CBE) every year. Depending on your age, family history, and medical history, it may be recommended that you also have a yearly breast X-ray (mammogram).  If you  have a family history of breast cancer, talk with your health care provider about genetic screening.  If you are at high risk for breast cancer, talk with your health care provider about having an MRI and a mammogram every year.  Breast cancer (BRCA) gene test is recommended for women who have family members with BRCA-related cancers. Results of the assessment will determine the need for genetic counseling and BRCA1 and for BRCA2 testing. BRCA-related cancers include these types: ? Breast. This occurs in males or females. ? Ovarian. ? Tubal. This may also be called fallopian tube cancer. ? Cancer of the abdominal or pelvic lining (peritoneal cancer). ? Prostate. ? Pancreatic.  Cervical, Uterine, and Ovarian Cancer Your health care provider may recommend that you be screened regularly for cancer of the pelvic organs. These include your ovaries, uterus, and vagina. This screening involves a pelvic exam, which includes checking for microscopic changes to the surface of your cervix (Pap test).  For women ages 21-65, health care providers may recommend a pelvic exam and a Pap test every three years. For women ages 79-65, they may recommend the Pap test and pelvic exam, combined with testing for human papilloma virus (HPV), every five years. Some types of HPV increase your risk of cervical cancer. Testing for HPV may also be done on women of any age who have unclear Pap test results.  Other health care providers may not recommend any screening for nonpregnant women who are considered low risk for pelvic cancer and have no symptoms. Ask your health care provider if a screening pelvic exam is right for you.  If you have had past treatment for cervical cancer or a condition that could lead to cancer, you need Pap tests and screening for cancer for at least 20 years after your treatment. If Pap tests have been discontinued for you, your risk factors (such as having a new sexual partner) need to be  reassessed to determine if you should start having screenings again. Some women have medical problems that increase the chance of getting cervical cancer. In these cases, your health care provider may recommend that you have screening and Pap tests more often.  If you have a family history of uterine cancer or ovarian cancer, talk with your health care provider about genetic screening.  If you have vaginal bleeding after reaching menopause, tell your health care provider.  There are currently no reliable tests available to screen for ovarian cancer.  Lung Cancer Lung cancer screening is recommended for adults 69-62 years old who are at high risk for lung cancer because of a history of smoking. A yearly low-dose CT scan of the lungs is recommended if you:  Currently smoke.  Have a history of at least 30 pack-years of smoking and you currently smoke or have quit within the past 15 years. A pack-year is smoking an average of one pack of cigarettes per day for one year.  Yearly screening should:  Continue until it has been 15 years since you quit.  Stop if you develop a health problem that would prevent you from having lung cancer treatment.  Colorectal Cancer  This type of cancer can be detected and can often be prevented.  Routine colorectal cancer screening usually begins at  age 42 and continues through age 45.  If you have risk factors for colon cancer, your health care provider may recommend that you be screened at an earlier age.  If you have a family history of colorectal cancer, talk with your health care provider about genetic screening.  Your health care provider may also recommend using home test kits to check for hidden blood in your stool.  A small camera at the end of a tube can be used to examine your colon directly (sigmoidoscopy or colonoscopy). This is done to check for the earliest forms of colorectal cancer.  Direct examination of the colon should be repeated every  5-10 years until age 71. However, if early forms of precancerous polyps or small growths are found or if you have a family history or genetic risk for colorectal cancer, you may need to be screened more often.  Skin Cancer  Check your skin from head to toe regularly.  Monitor any moles. Be sure to tell your health care provider: ? About any new moles or changes in moles, especially if there is a change in a mole's shape or color. ? If you have a mole that is larger than the size of a pencil eraser.  If any of your family members has a history of skin cancer, especially at a Cereniti Curb age, talk with your health care provider about genetic screening.  Always use sunscreen. Apply sunscreen liberally and repeatedly throughout the day.  Whenever you are outside, protect yourself by wearing long sleeves, pants, a wide-brimmed hat, and sunglasses.  What should I know about osteoporosis? Osteoporosis is a condition in which bone destruction happens more quickly than new bone creation. After menopause, you may be at an increased risk for osteoporosis. To help prevent osteoporosis or the bone fractures that can happen because of osteoporosis, the following is recommended:  If you are 46-71 years old, get at least 1,000 mg of calcium and at least 600 mg of vitamin D per day.  If you are older than age 55 but younger than age 65, get at least 1,200 mg of calcium and at least 600 mg of vitamin D per day.  If you are older than age 54, get at least 1,200 mg of calcium and at least 800 mg of vitamin D per day.  Smoking and excessive alcohol intake increase the risk of osteoporosis. Eat foods that are rich in calcium and vitamin D, and do weight-bearing exercises several times each week as directed by your health care provider. What should I know about how menopause affects my mental health? Depression may occur at any age, but it is more common as you become older. Common symptoms of depression  include:  Low or sad mood.  Changes in sleep patterns.  Changes in appetite or eating patterns.  Feeling an overall lack of motivation or enjoyment of activities that you previously enjoyed.  Frequent crying spells.  Talk with your health care provider if you think that you are experiencing depression. What should I know about immunizations? It is important that you get and maintain your immunizations. These include:  Tetanus, diphtheria, and pertussis (Tdap) booster vaccine.  Influenza every year before the flu season begins.  Pneumonia vaccine.  Shingles vaccine.  Your health care provider may also recommend other immunizations. This information is not intended to replace advice given to you by your health care provider. Make sure you discuss any questions you have with your health care provider. Document Released: 08/08/2005  Document Revised: 01/04/2016 Document Reviewed: 03/20/2015 Elsevier Interactive Patient Education  2018 [place] After being diagnosed with an anxiety disorder, you may be relieved to know why you have felt or behaved a certain way. It is natural to also feel overwhelmed about the treatment ahead and what it will mean for your life. With care and support, you can manage this condition and recover from it. How to cope with anxiety Dealing with stress Stress is your body's reaction to life changes and events, both good and bad. Stress can last just a few hours or it can be ongoing. Stress can play a major role in anxiety, so it is important to learn both how to cope with stress and how to think about it differently. Talk with your health care provider or a counselor to learn more about stress reduction. He or she may suggest some stress reduction techniques, such as:  Music therapy. This can include creating or listening to music that you enjoy and that inspires you.  Mindfulness-based meditation. This involves being aware of your  normal breaths, rather than trying to control your breathing. It can be done while sitting or walking.  Centering prayer. This is a kind of meditation that involves focusing on a word, phrase, or sacred image that is meaningful to you and that brings you peace.  Deep breathing. To do this, expand your stomach and inhale slowly through your nose. Hold your breath for 3-5 seconds. Then exhale slowly, allowing your stomach muscles to relax.  Self-talk. This is a skill where you identify thought patterns that lead to anxiety reactions and correct those thoughts.  Muscle relaxation. This involves tensing muscles then relaxing them.  Choose a stress reduction technique that fits your lifestyle and personality. Stress reduction techniques take time and practice. Set aside 5-15 minutes a day to do them. Therapists can offer training in these techniques. The training may be covered by some insurance plans. Other things you can do to manage stress include:  Keeping a stress diary. This can help you learn what triggers your stress and ways to control your response.  Thinking about how you respond to certain situations. You may not be able to control everything, but you can control your reaction.  Making time for activities that help you relax, and not feeling guilty about spending your time in this way.  Therapy combined with coping and stress-reduction skills provides the best chance for successful treatment. Medicines Medicines can help ease symptoms. Medicines for anxiety include:  Anti-anxiety drugs.  Antidepressants.  Beta-blockers.  Medicines may be used as the main treatment for anxiety disorder, along with therapy, or if other treatments are not working. Medicines should be prescribed by a health care provider. Relationships Relationships can play a big part in helping you recover. Try to spend more time connecting with trusted friends and family members. Consider going to couples  counseling, taking family education classes, or going to family therapy. Therapy can help you and others better understand the condition. How to recognize changes in your condition Everyone has a different response to treatment for anxiety. Recovery from anxiety happens when symptoms decrease and stop interfering with your daily activities at home or work. This may mean that you will start to:  Have better concentration and focus.  Sleep better.  Be less irritable.  Have more energy.  Have improved memory.  It is important to recognize when your condition is getting worse. Contact your health care provider  if your symptoms interfere with home or work and you do not feel like your condition is improving. Where to find help and support: You can get help and support from these sources:  Self-help groups.  Online and [employer].  A trusted spiritual leader.  Couples counseling.  Family education classes.  Family therapy.  Follow these instructions at home:  Eat a healthy diet that includes plenty of vegetables, fruits, whole grains, low-fat dairy products, and lean protein. Do not eat a lot of foods that are high in solid fats, added sugars, or salt.  Exercise. Most adults should do the following: ? Exercise for at least 150 minutes each week. The exercise should increase your heart rate and make you sweat (moderate-intensity exercise). ? Strengthening exercises at least twice a week.  Cut down on caffeine, tobacco, alcohol, and other potentially harmful substances.  Get the right amount and quality of sleep. Most adults need 7-9 hours of sleep each night.  Make choices that simplify your life.  Take over-the-counter and prescription medicines only as told by your health care provider.  Avoid caffeine, alcohol, and certain over-the-counter cold medicines. These may make you feel worse. Ask your pharmacist which medicines to avoid.  Keep all follow-up visits as  told by your health care provider. This is important. Questions to ask your health care provider  Would I benefit from therapy?  How often should I follow up with a health care provider?  How long do I need to take medicine?  Are there any long-term side effects of my medicine?  Are there any alternatives to taking medicine? Contact a health care provider if:  You have a hard time staying focused or finishing daily tasks.  You spend many hours a day feeling worried about everyday life.  You become exhausted by worry.  You start to have headaches, feel tense, or have nausea.  You urinate more than normal.  You have diarrhea. Get help right away if:  You have a racing heart and shortness of breath.  You have thoughts of hurting yourself or others. If you ever feel like you may hurt yourself or others, or have thoughts about taking your own life, get help right away. You can go to your nearest emergency department or call:  Your local emergency services (911 in the [place]  A suicide crisis helpline, such as the [facility] at (901) 801-3168. This is open 24-hours a day.  Summary  Taking steps to deal with stress can help calm you.  Medicines cannot cure anxiety disorders, but they can help ease symptoms.  Family, friends, and partners can play a big part in helping you recover from an anxiety disorder. This information is not intended to replace advice given to you by your health care provider. Make sure you discuss any questions you have with your health care provider. Document Released: 06/10/2016 Document Revised: 06/10/2016 Document Reviewed: 06/10/2016 Elsevier Interactive Patient Education  2018 [place]  Arthritis Arthritis means joint pain. It can also mean joint disease. A joint is a place where bones come together. People who have arthritis may have:  Red joints.  Swollen joints.  Stiff joints.  Warm joints.  A  fever.  A feeling of being sick.  Follow these instructions at home: Pay attention to any changes in your symptoms. Take these actions to help with your pain and swelling. Medicines  Take over-the-counter and prescription medicines only as told by your doctor.  Do not take aspirin  for pain if your doctor says that you may have gout. Activity  Rest your joint if your doctor tells you to.  Avoid activities that make the pain worse.  Exercise your joint regularly as told by your doctor. Try doing exercises like: ? Swimming. ? Water aerobics. ? Biking. ? Walking. Joint Care   If your joint is swollen, keep it raised (elevated) if told by your doctor.  If your joint feels stiff in the morning, try taking a warm shower.  If you have diabetes, do not apply heat without asking your doctor.  If told, apply heat to the joint: ? Put a towel between the joint and the hot pack or heating pad. ? Leave the heat on the area for 20-30 minutes.  If told, apply ice to the joint: ? Put ice in a plastic bag. ? Place a towel between your skin and the bag. ? Leave the ice on for 20 minutes, 2-3 times per day.  Keep all follow-up visits as told by your doctor. Contact a doctor if:  The pain gets worse.  You have a fever. Get help right away if:  You have very bad pain in your joint.  You have swelling in your joint.  Your joint is red.  Many joints become painful and swollen.  You have very bad back pain.  Your leg is very weak.  You cannot control your pee (urine) or poop (stool). This information is not intended to replace advice given to you by your health care provider. Make sure you discuss any questions you have with your health care provider. Document Released: 09/10/2009 Document Revised: 11/22/2015 Document Reviewed: 09/11/2014 Elsevier Interactive Patient Education  [employer].

## 2018-01-06 NOTE — Progress Notes (Signed)
Christina [name] 01/22/1956 [id number]    History:    Presents for annual exam.  2006 TVH for DUB and endometriosis.  Abnormal Pap greater than 20 years ago normal after.  2015- colonoscopy.  2016 T score -2.1 FRAX 9.2% / 1.3%.  History of low vitamin D and elevated cholesterol.  Struggles with chronic back pain.  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  All of her children are well.  Has had numerous problems with nausea, vomiting and diarrhea with extensive testing with no cause found through GI endoscopy/ colonoscopy or labs.  Minimal weight loss.  Past medical history, past surgical history, family history and social history were all reviewed and documented in the EPIC chart.  4 children all doing well.  ROS:  A ROS was performed and pertinent positives and negatives are included.  Exam:  Vitals:   01/06/18 1411  BP: 128/84  Weight: 149 lb 12.8 oz (67.9 kg)  Height: 5' 4.3" (1.633 m)   Body mass index is 25.47 kg/m.   General appearance:  Normal Thyroid:  Symmetrical, normal in size, without palpable masses or nodularity. Respiratory  Auscultation:  Clear without wheezing or rhonchi Cardiovascular  Auscultation:  Regular rate, without rubs, murmurs or gallops  Edema/varicosities:  Not grossly evident Abdominal  Soft,nontender, without masses, guarding or rebound.  Liver/spleen:  No organomegaly noted  Hernia:  None appreciated  Skin  Inspection:  Grossly normal   Breasts: Examined lying and sitting.     Right: Without masses, retractions, discharge or axillary adenopathy.     Left: Without masses, retractions, discharge or axillary adenopathy. Gentitourinary   Inguinal/mons:  Normal without inguinal adenopathy  External genitalia:  Normal  BUS/Urethra/Skene's glands:  Normal  Vagina:  Normal  Cervix: And uterus absent              adnexa/parametria:     Rt: Without masses or  tenderness.   Lt: Without masses or tenderness.  Anus and perineum: Normal  Digital rectal exam: Normal sphincter tone without palpated masses or tenderness  Assessment/Plan:  62 y.o. MWF G4, P4 for annual exam.    2006 TVH for DU B and endometriosis Anxiety depression/situational stress Nausea/vomiting/diarrhea-GI managing  chronic back pain-orthopedist managing Osteopenia without elevated FRAX HSV 1 rare outbreaks Blood pressure elevations-instructed to follow-up with primary care  Plan: Encouraged follow-up with GI for GI problems, has not had a stool culture.  SBE's, annual screening mammogram, overdue strongly encouraged annual screening.  Exercise as able, water aerobics encouraged.  Vitamin D 2000 daily encouraged.  Has been on Zoloft in the past would like to try something different we will start with Prozac 10 mg would like lowest dose possible reviewed it is a low dose may need to increase to at least 20 mg.  Rx 500 twice daily for 3 to 5 days as needed prescription given.  Reviewed importance of leisure activities, counseling, and self-care activities.  TSH,  DEXA.   Harrington Challengerancy J Young [facility] AlbanyWHNP, 2:23 PM 01/06/2018

## 2018-01-07 ENCOUNTER — Other Ambulatory Visit: Payer: Self-pay | Admitting: Women's Health

## 2018-01-07 MED ORDER — FLUOXETINE HCL 10 MG PO CAPS: 10.0000 mg | ORAL_CAPSULE | Freq: Every day | ORAL | 2 refills | Status: DC

## 2018-01-21 ENCOUNTER — Telehealth: Payer: Self-pay | Admitting: *Deleted

## 2018-01-21 NOTE — Telephone Encounter (Signed)
-----   Message from Harrington ChallengerNancy J Young, NP sent at 01/20/2018  5:45 PM EDT ----- Please call for referral for Dr Corliss Skainseveshwar for probable fibromyalgia.  Joint pain and stiffness, low back pain, knee pain, and IBS type symptoms, N/V/D and no energy. Also has Anxiety and depression, on prozac. Symptoms have been present for 7 mo, episodic,  Can go anytime.

## 2018-01-21 NOTE — Telephone Encounter (Signed)
Referral faxed to Dr. Drusilla Kannereveshawar office for review, I call and left a message on patient voicemail referral may not be approved, if approved they will call to schedule.

## 2018-02-03 NOTE — Progress Notes (Signed)
Office Visit Note  Patient: Christina [name]             Date of Birth: 10-05-1955           MRN: [id number]             PCP: Huel Cote, NP Referring: Huel Cote, NP Visit Date: 02/17/2018 Occupation: Armed forces operational officer  Subjective:  Pain in multiple joints.   History of Present Illness: Christina [name] is a 62 y.o. female in consultation per request of her PCP.  According to patient she has had lower back pain for multiple years.  She states the lower back pain is started after her twins were born.  Last year she had SI joint injection which lasted for only 2 weeks and then she had rebound pain which was more intense.  She continues to have lower back pain.  She states she went through a traumatic event in January and after that she started experiencing episodes of nausea, vomiting, diarrhea and intense fatigue.  She states these symptoms last for about 5 to 7 days and then go away.  After 10 days the symptoms were to recur.  This cycle has been going on since then.  She was seen by a gastroenterologist Dr. Penelope Coop who did work-up including endoscopy colonoscopy and lab work which was all within normal limits.  She was seen by her GYN who evaluated her and thought that she may have fibromyalgia.  She was a started on Prozac which she has been taking for 1 month.  She has noticed some improvement with that.  She continues to have generalized pain in her joints which she describes most intense in her bilateral CMC joints, left knee joint and lower back.  Although all of her joints are painful.  Noticed some swelling in her hands intermittently.  She denies any muscle pain.  She states the weather change especially the rain causes increased discomfort.  She gives history of insomnia for many years.  She has a history of anxiety and depression.  Activities of Daily Living:  Patient reports morning stiffness for 1-2 hours.   Patient Reports nocturnal pain.  Difficulty dressing/grooming: Denies Difficulty  climbing stairs: Reports Difficulty getting out of chair: Reports Difficulty using hands for taps, buttons, cutlery, and/or writing: Denies  Review of Systems  Constitutional: Positive for fatigue. Negative for night sweats, weight gain and weight loss.  HENT: Negative for mouth sores, trouble swallowing, trouble swallowing, mouth dryness and nose dryness.   Eyes: Positive for itching. Negative for pain, redness, visual disturbance and dryness.  Respiratory: Negative for cough, shortness of breath and difficulty breathing.   Cardiovascular: Negative for chest pain, palpitations, hypertension, irregular heartbeat and swelling in legs/feet.  Gastrointestinal: Positive for diarrhea, nausea and vomiting. Negative for blood in stool and constipation.  Endocrine: Negative for increased urination.  Genitourinary: Negative for vaginal dryness.  Musculoskeletal: Positive for arthralgias, joint pain and morning stiffness. Negative for joint swelling, myalgias, muscle weakness, muscle tenderness and myalgias.  Skin: Negative for color change, rash, hair loss, skin tightness, ulcers and sensitivity to sunlight.  Allergic/Immunologic: Negative for susceptible to infections.  Neurological: Negative for dizziness, memory loss, night sweats and weakness.  Hematological: Negative for swollen glands.  Psychiatric/Behavioral: Positive for depressed mood and sleep disturbance. The patient is nervous/anxious.     PMFS History:  Patient Active Problem List   Diagnosis Date Noted  . History of total vaginal hysterectomy (TVH) 12/18/2015  . Chronic back  pain 03/25/2013    Past Medical History:  Diagnosis Date  . Depression     Family History  Problem Relation Age of Onset  . Cancer Mother        brain  . Ovarian cancer Mother   . Cancer Father        prostate  . Breast cancer Maternal Grandmother   . Ovarian cancer Maternal Grandmother   . Heart Problems Brother   . Healthy Daughter   . Healthy  Daughter   . Healthy Son   . Healthy Son    Past Surgical History:  Procedure Laterality Date  . CARPAL TUNNEL RELEASE Bilateral 05/21/2013   BILATERAL CARPAL TUNNEL SURGERY  . CESAREAN SECTION  1990  . CESAREAN SECTION  1992  . VAGINAL HYSTERECTOMY     Social History   Social History Narrative  . Not on file    Objective: Vital Signs: BP (!) 174/95 (BP Location: Right Arm, Patient Position: Sitting, Cuff Size: Normal)   Pulse 72   Resp 14   Ht 5' 2.6" (1.59 m)   Wt 148 lb 6.4 oz (67.3 kg)   BMI 26.63 kg/m    Physical Exam  Constitutional: She is oriented to person, place, and time. She appears well-developed and well-nourished.  HENT:  Head: Normocephalic and atraumatic.  Eyes: Conjunctivae and EOM are normal.  Neck: Normal range of motion.  Cardiovascular: Normal rate, regular rhythm, normal heart sounds and intact distal pulses.  Pulmonary/Chest: Effort normal and breath sounds normal.  Abdominal: Soft. Bowel sounds are normal.  Lymphadenopathy:    She has no cervical adenopathy.  Neurological: She is alert and oriented to person, place, and time.  Skin: Skin is warm and dry. Capillary refill takes less than 2 seconds.  Psychiatric: She has a normal mood and affect. Her behavior is normal.  Nursing note and vitals reviewed.    Musculoskeletal Exam: C-spine good range of motion.  Thoracic and lumbar spine good range of motion.  She had tenderness on palpation of bilateral SI joints.  She has some tenderness over the trapezius area, gluteal area and trochanteric bursa.  Shoulder joints elbow joints wrist joints were in good range of motion.  She has bilateral CMC thickening PIP and DIP thickening.  She had tenderness on palpation of her bilateral CMC's and PIP joints.  Hip joints were in good range of motion without discomfort.  She had discomfort range of motion of her left knee joint without any warmth swelling or effusion.  She had past cavus bilaterally with bilateral  first MTP thickening.  She has calluses under her ball of the foot and heels.  CDAI Exam: No CDAI exam completed.   Investigation: No additional findings.  Imaging: Xr Foot 2 Views Left  Result Date: 02/17/2018 First MTP narrowing and subluxation was noted.  PIP and DIP narrowing was noted.  No erosive changes were noted.  Dorsal spurring was noted.  No tibiotalar joint space narrowing was noted.  Inferior and posterior calcaneal spurs were noted. Impression: These findings are consistent with osteoarthritis of the foot.  Xr Foot 2 Views Right  Result Date: 02/17/2018 First MTP narrowing was noted.  PIP and DIP narrowing was noted.  No erosive changes were noted.  Dorsal spurring was noted.  No tibiotalar joint space narrowing was noted. Posterior calcaneal spurs were noted. Impression: These findings are consistent with osteoarthritis of the foot.  Xr Hand 2 View Left  Result Date: 02/17/2018 PIP, DIP, CMC narrowing was  noted.  No MCP, intercarpal radiocarpal joint space narrowing was noted.  No erosive changes were noted. Impression: These findings are consistent with osteoarthritis of the hand.  Xr Hand 2 View Right  Result Date: 02/17/2018 PIP, DIP, CMC narrowing was noted.  No MCP, intercarpal radiocarpal joint space narrowing was noted.  No erosive changes were noted. Impression: These findings are consistent with osteoarthritis of the hand.  Xr Knee 3 View Left  Result Date: 02/17/2018 Mild medial compartment narrowing was noted.  No chondrocalcinosis was noted.  Mild patellofemoral narrowing was noted. Impression: These findings are consistent with mild osteoarthritis and mild chondromalacia patella.  Xr Pelvis 1-2 Views  Result Date: 02/17/2018 No SI joint to sclerosis or narrowing was noted. Impression: Unremarkable x-ray of the SI joint.   Recent Labs: Lab Results  Component Value Date   WBC 8.2 12/30/2016   HGB 13.9 12/30/2016   PLT 334 12/30/2016   NA 140  12/30/2016   K 4.0 12/30/2016   CL 105 12/30/2016   CO2 23 12/30/2016   GLUCOSE 86 12/30/2016   BUN 19 12/30/2016   CREATININE 0.88 12/30/2016   BILITOT 0.6 12/30/2016   ALKPHOS 69 12/30/2016   AST 16 12/30/2016   ALT 17 12/30/2016   PROT 7.0 12/30/2016   ALBUMIN 4.4 12/30/2016   CALCIUM 9.4 12/30/2016   GFRAA >60 07/14/2016    Speciality Comments: No specialty comments available.  Procedures:  No procedures performed Allergies: Toradol [ketorolac tromethamine] and Ultram [tramadol]   Assessment / Plan:     Visit Diagnoses: Polyarthralgia -patient complains of generalized pain in multiple joints for several years.  She had no synovitis on examination today.  She also had few tender points which raises the concern of possible myofascial pain syndrome.  Pain in both hands -she had tenderness on palpation of her bilateral CMC joints and bilateral PIP joints.  Plan: XR Hand 2 View Right, XR Hand 2 View Left, x-rays were consistent with osteoarthritis.  She gives history of intermittent swelling in her hands I will obtain following labs today.  I will also schedule ultrasound of bilateral hands to evaluate for synovitis.  Sedimentation rate, Rheumatoid factor, Cyclic citrul peptide antibody, IgG, ANA, Uric acid, Gliadin antibodies, serum, Tissue transglutaminase, IgA  Chronic SI joint pain -she complains of chronic SI joint pain.  She has had cortisone injection in the past which limited relief.  Plan: XR Pelvis 1-2 Views, x-ray of the pelvis was unremarkable.  HLA-B27 antigen  Chronic pain of left knee -she has discomfort in her left knee joint.  No warmth swelling or effusion was noted.  Plan: XR KNEE 3 VIEW LEFT.  The x-ray showed mild osteoarthritis and mild chondromalacia patella.  Pain in both feet -she has bilateral pes cavus.  She also has multiple calluses on her feet.  Proper fitting shoes with arch support were discussed.  Plan: XR Foot 2 Views Right, XR Foot 2 Views Left.  The  x-rays were consistent of osteoarthritis bilateral feet.  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 XR Pelvis 1-2 Views  . XR KNEE 3 VIEW LEFT  . XR  Hand 2 View Right  . XR Hand 2 View Left  . XR Foot 2 Views Right  . XR Foot 2 Views Left  . Sedimentation rate  . CK  . TSH  . Rheumatoid factor  . Cyclic citrul peptide antibody, IgG  . ANA  . HLA-B27 antigen  . Uric acid  . Gliadin antibodies, serum  . Tissue transglutaminase, IgA   No orders of the defined types were placed in this encounter.   Face-to-face time spent with patient was 50 minutes. Greater than 50% of time was spent in counseling and coordination of care.  Follow-Up Instructions: Return for polyarthralgia.   Bo Merino, MD  Note - This record has been created using Editor, commissioning.  Chart creation errors have been sought, but may not always  have been located. Such creation errors do not reflect on  the standard of medical care.

## 2018-02-15 DIAGNOSIS — K580 Irritable bowel syndrome with diarrhea: Secondary | ICD-10-CM | POA: Diagnosis not present

## 2018-02-15 DIAGNOSIS — Z86010 Personal history of colonic polyps: Secondary | ICD-10-CM | POA: Diagnosis not present

## 2018-02-15 DIAGNOSIS — K2950 Unspecified chronic gastritis without bleeding: Secondary | ICD-10-CM | POA: Diagnosis not present

## 2018-02-17 ENCOUNTER — Ambulatory Visit (INDEPENDENT_AMBULATORY_CARE_PROVIDER_SITE_OTHER): Payer: Self-pay

## 2018-02-17 ENCOUNTER — Encounter: Payer: Self-pay | Admitting: Rheumatology

## 2018-02-17 ENCOUNTER — Ambulatory Visit: Payer: BLUE CROSS/BLUE SHIELD | Admitting: Rheumatology

## 2018-02-17 VITALS — BP 174/95 | HR 72 | Resp 14 | Ht 62.6 in | Wt 148.4 lb

## 2018-02-17 DIAGNOSIS — M79641 Pain in right hand: Secondary | ICD-10-CM

## 2018-02-17 DIAGNOSIS — F329 Major depressive disorder, single episode, unspecified: Secondary | ICD-10-CM

## 2018-02-17 DIAGNOSIS — M533 Sacrococcygeal disorders, not elsewhere classified: Secondary | ICD-10-CM

## 2018-02-17 DIAGNOSIS — M25562 Pain in left knee: Secondary | ICD-10-CM | POA: Diagnosis not present

## 2018-02-17 DIAGNOSIS — M79671 Pain in right foot: Secondary | ICD-10-CM | POA: Diagnosis not present

## 2018-02-17 DIAGNOSIS — Z8639 Personal history of other endocrine, nutritional and metabolic disease: Secondary | ICD-10-CM

## 2018-02-17 DIAGNOSIS — M8589 Other specified disorders of bone density and structure, multiple sites: Secondary | ICD-10-CM

## 2018-02-17 DIAGNOSIS — M2550 Pain in unspecified joint: Secondary | ICD-10-CM

## 2018-02-17 DIAGNOSIS — R5383 Other fatigue: Secondary | ICD-10-CM

## 2018-02-17 DIAGNOSIS — B009 Herpesviral infection, unspecified: Secondary | ICD-10-CM

## 2018-02-17 DIAGNOSIS — M79672 Pain in left foot: Secondary | ICD-10-CM

## 2018-02-17 DIAGNOSIS — M79642 Pain in left hand: Secondary | ICD-10-CM

## 2018-02-17 DIAGNOSIS — M25561 Pain in right knee: Secondary | ICD-10-CM | POA: Diagnosis not present

## 2018-02-17 DIAGNOSIS — M545 Low back pain: Secondary | ICD-10-CM

## 2018-02-17 DIAGNOSIS — Z90710 Acquired absence of both cervix and uterus: Secondary | ICD-10-CM

## 2018-02-17 DIAGNOSIS — F419 Anxiety disorder, unspecified: Secondary | ICD-10-CM

## 2018-02-17 DIAGNOSIS — G8929 Other chronic pain: Secondary | ICD-10-CM

## 2018-02-17 DIAGNOSIS — F32A Depression, unspecified: Secondary | ICD-10-CM

## 2018-02-19 ENCOUNTER — Other Ambulatory Visit: Payer: Self-pay | Admitting: *Deleted

## 2018-02-19 ENCOUNTER — Telehealth: Payer: Self-pay | Admitting: *Deleted

## 2018-02-19 DIAGNOSIS — G8929 Other chronic pain: Secondary | ICD-10-CM

## 2018-02-19 DIAGNOSIS — M533 Sacrococcygeal disorders, not elsewhere classified: Principal | ICD-10-CM

## 2018-02-19 LAB — SEDIMENTATION RATE: SED RATE: 9 mm/h (ref 0–30)

## 2018-02-19 LAB — TISSUE TRANSGLUTAMINASE, IGA: (TTG) AB, IGA: 1 U/mL

## 2018-02-19 LAB — GLIADIN ANTIBODIES, SERUM
GLIADIN IGA: 4 U
GLIADIN IGG: 2 U

## 2018-02-19 LAB — ANA: Anti Nuclear Antibody(ANA): NEGATIVE

## 2018-02-19 LAB — CYCLIC CITRUL PEPTIDE ANTIBODY, IGG: Cyclic Citrullin Peptide Ab: <16 UNITS

## 2018-02-19 LAB — CK: CK TOTAL: 52 U/L (ref 29–143)

## 2018-02-19 LAB — TSH: TSH: 0.31 m[IU]/L — AB (ref 0.40–4.50)

## 2018-02-19 LAB — HLA-B27 ANTIGEN: HLA-B27 ANTIGEN: POSITIVE — AB

## 2018-02-19 LAB — RHEUMATOID FACTOR

## 2018-02-19 LAB — URIC ACID: Uric Acid, Serum: 6 mg/dL (ref 2.5–7.0)

## 2018-02-19 NOTE — Telephone Encounter (Signed)
-----  Message from Bo Merino, MD sent at 02/19/2018  2:46 PM EDT ----- Patient has been having chronic SI joint pain.  X-ray was unremarkable.  She is HLA-B27 positive.  Please discuss with patient if she will be willing to get MRI of the SI joints to rule out sacroiliitis.

## 2018-02-19 NOTE — Progress Notes (Signed)
Patient has been having chronic SI joint pain.  X-ray was unremarkable.  She is HLA-B27 positive.  Please discuss with patient if she will be willing to get MRI of the SI joints to rule out sacroiliitis.

## 2018-03-03 ENCOUNTER — Ambulatory Visit: Payer: BLUE CROSS/BLUE SHIELD | Admitting: Rheumatology

## 2018-03-03 ENCOUNTER — Ambulatory Visit (INDEPENDENT_AMBULATORY_CARE_PROVIDER_SITE_OTHER): Payer: Self-pay

## 2018-03-03 DIAGNOSIS — M79642 Pain in left hand: Secondary | ICD-10-CM | POA: Diagnosis not present

## 2018-03-03 DIAGNOSIS — M79641 Pain in right hand: Secondary | ICD-10-CM

## 2018-03-09 ENCOUNTER — Ambulatory Visit: Payer: BLUE CROSS/BLUE SHIELD | Admitting: Family Medicine

## 2018-03-16 ENCOUNTER — Telehealth: Payer: Self-pay | Admitting: Rheumatology

## 2018-03-16 DIAGNOSIS — M19071 Primary osteoarthritis, right ankle and foot: Secondary | ICD-10-CM | POA: Insufficient documentation

## 2018-03-16 DIAGNOSIS — M8589 Other specified disorders of bone density and structure, multiple sites: Secondary | ICD-10-CM | POA: Insufficient documentation

## 2018-03-16 DIAGNOSIS — E559 Vitamin D deficiency, unspecified: Secondary | ICD-10-CM | POA: Insufficient documentation

## 2018-03-16 DIAGNOSIS — M19041 Primary osteoarthritis, right hand: Secondary | ICD-10-CM | POA: Insufficient documentation

## 2018-03-16 DIAGNOSIS — M1712 Unilateral primary osteoarthritis, left knee: Secondary | ICD-10-CM | POA: Insufficient documentation

## 2018-03-16 DIAGNOSIS — M19042 Primary osteoarthritis, left hand: Secondary | ICD-10-CM

## 2018-03-16 DIAGNOSIS — M19072 Primary osteoarthritis, left ankle and foot: Secondary | ICD-10-CM

## 2018-03-16 NOTE — Telephone Encounter (Signed)
Patient called stating she received the letter from her insurance company stating that her MRI has been denied.  Patient is checking to see if there is anything she needs to do next.  Patient requested a return call.

## 2018-03-16 NOTE — Telephone Encounter (Signed)
Patient advised we have been awaiting a call to appeal decision. Patient provided information from letter she received to appeal the decision.  Phone Number [phone] Contract # [id number] Treatment Code [id number] Mri Scan of pelvis

## 2018-03-16 NOTE — Progress Notes (Deleted)
Office Visit Note  Patient: Christina [name]             Date of Birth: [birth year]           MRN: [id number]             PCP: Huel Cote, NP Referring: Huel Cote, NP Visit Date: 03/30/2018 Occupation: _0 @  Subjective:  No chief complaint on file.   History of Present Illness: Christina [name] is a 62 y.o. female ***   Activities of Daily Living:  Patient reports morning stiffness for *** {minute/hour:19697}.   Patient {ACTIONS;DENIES/REPORTS:21021675::"Denies"} nocturnal pain.  Difficulty dressing/grooming: {ACTIONS;DENIES/REPORTS:21021675::"Denies"} Difficulty climbing stairs: {ACTIONS;DENIES/REPORTS:21021675::"Denies"} Difficulty getting out of chair: {ACTIONS;DENIES/REPORTS:21021675::"Denies"} Difficulty using hands for taps, buttons, cutlery, and/or writing: {ACTIONS;DENIES/REPORTS:21021675::"Denies"}  No Rheumatology ROS completed.   PMFS History:  Patient Active Problem List   Diagnosis Date Noted  . History of total vaginal hysterectomy (TVH) 12/18/2015  . Chronic back pain 03/25/2013    Past Medical History:  Diagnosis Date  . Depression     Family History  Problem Relation Age of Onset  . Cancer Mother        brain  . Ovarian cancer Mother   . Cancer Father        prostate  . Breast cancer Maternal Grandmother   . Ovarian cancer Maternal Grandmother   . Heart Problems Brother   . Healthy Daughter   . Healthy Daughter   . Healthy Son   . Healthy Son    Past Surgical History:  Procedure Laterality Date  . CARPAL TUNNEL RELEASE Bilateral 05/21/2013   BILATERAL CARPAL TUNNEL SURGERY  . CESAREAN SECTION  1990  . CESAREAN SECTION  1992  . VAGINAL HYSTERECTOMY     Social History   Social History Narrative  . Not on file    Objective: Vital Signs: There were no vitals taken for this visit.   Physical Exam   Musculoskeletal Exam: ***  CDAI Exam: CDAI Score: Not documented Patient Global Assessment: Not documented; Provider Global  Assessment: Not documented Swollen: Not documented; Tender: Not documented Joint Exam   Not documented   There is currently no information documented on the homunculus. Go to the Rheumatology activity and complete the homunculus joint exam.  Investigation: No additional findings.  Imaging: [place] Extrem Up Bilat Comp  Result Date: 03/03/2018 Ultrasound examination of bilateral hands was performed per EULAR recommendations. Using 12 MHz transducer, grayscale and power Doppler bilateral second, third, and fifth MCP joints, PIP and DIP joints of second, third and fifth digits and bilateral wrist joints both dorsal and volar aspects were evaluated to look for synovitis or tenosynovitis. The findings were there was mild synovitis over third and fifth MCPs on volar aspect and right wrist on radial aspect, no erosive changes were noted on ultrasound examination. Right median nerve was 0.06 cm squares which was within normal limits and left median nerve was 0.07 cm squares which was within normal limits. Impression: Ultrasound examination revealed mild inflammatory changes in the right third and fifth MCP and right wrist joint.  Lateral median nerves are within normal limits.  Xr Foot 2 Views Left  Result Date: 02/17/2018 First MTP narrowing and subluxation was noted.  PIP and DIP narrowing was noted.  No erosive changes were noted.  Dorsal spurring was noted.  No tibiotalar joint space narrowing was noted.  Inferior and posterior calcaneal spurs were noted. Impression: These findings are consistent with osteoarthritis of the foot.  Xr Foot  2 Views Right  Result Date: 02/17/2018 First MTP narrowing was noted.  PIP and DIP narrowing was noted.  No erosive changes were noted.  Dorsal spurring was noted.  No tibiotalar joint space narrowing was noted. Posterior calcaneal spurs were noted. Impression: These findings are consistent with osteoarthritis of the foot.  Xr Hand 2 View Left  Result Date:  02/17/2018 PIP, DIP, CMC narrowing was noted.  No MCP, intercarpal radiocarpal joint space narrowing was noted.  No erosive changes were noted. Impression: These findings are consistent with osteoarthritis of the hand.  Xr Hand 2 View Right  Result Date: 02/17/2018 PIP, DIP, CMC narrowing was noted.  No MCP, intercarpal radiocarpal joint space narrowing was noted.  No erosive changes were noted. Impression: These findings are consistent with osteoarthritis of the hand.  Xr Knee 3 View Left  Result Date: 02/17/2018 Mild medial compartment narrowing was noted.  No chondrocalcinosis was noted.  Mild patellofemoral narrowing was noted. Impression: These findings are consistent with mild osteoarthritis and mild chondromalacia patella.  Xr Pelvis 1-2 Views  Result Date: 02/17/2018 No SI joint to sclerosis or narrowing was noted. Impression: Unremarkable x-ray of the SI joint.   Recent Labs: Lab Results  Component Value Date   WBC 8.2 12/30/2016   HGB 13.9 12/30/2016   PLT 334 12/30/2016   NA 140 12/30/2016   K 4.0 12/30/2016   CL 105 12/30/2016   CO2 23 12/30/2016   GLUCOSE 86 12/30/2016   BUN 19 12/30/2016   CREATININE 0.88 12/30/2016   BILITOT 0.6 12/30/2016   ALKPHOS 69 12/30/2016   AST 16 12/30/2016   ALT 17 12/30/2016   PROT 7.0 12/30/2016   ALBUMIN 4.4 12/30/2016   CALCIUM 9.4 12/30/2016   GFRAA >60 07/14/2016  Anti-tTG negative, antigliadin negative, ESR 9, CK 52, TSH low at 0.31, uric acid 6.0, RF negative, anti-CCP negative, ANA negative, HLA-B27 positive  Speciality Comments: No specialty comments available.  Procedures:  No procedures performed Allergies: Toradol [ketorolac tromethamine] and Ultram [tramadol]   Assessment / Plan:     Visit Diagnoses: No diagnosis found.   Orders: No orders of the defined types were placed in this encounter.  No orders of the defined types were placed in this encounter.   Face-to-face time spent with patient was *** minutes.  Greater than 50% of time was spent in counseling and coordination of care.  Follow-Up Instructions: No follow-ups on file.   Bo Merino, MD  Note - This record has been created using Editor, commissioning.  Chart creation errors have been sought, but may not always  have been located. Such creation errors do not reflect on  the standard of medical care.

## 2018-03-19 ENCOUNTER — Telehealth: Payer: Self-pay | Admitting: Rheumatology

## 2018-03-19 NOTE — Telephone Encounter (Signed)
Spoke with patient who states that she received another letter from [employer] insurance [employer] stating that it was a final denial. Patient advised we have not appealed the decision. Patient states she will bring letters to the office so we may see them an appeal the decision. Patient will come 03/22/18. Will start appeal after letters received from patient.

## 2018-03-19 NOTE — Telephone Encounter (Signed)
Patient called requesting a return call.  Patient states she received a letter in the mail from her insurance company denying her MRI.

## 2018-03-19 NOTE — Telephone Encounter (Signed)
Spoke with patient who states that she received another letter from [employer] insurance company stating that it was a final denial. Patient advised we have not appealed the decision. Patient states she will bring letters to the office so we may see them an appeal the decision. Patient will come 03/22/18.

## 2018-03-30 ENCOUNTER — Telehealth: Payer: Self-pay | Admitting: Rheumatology

## 2018-03-30 ENCOUNTER — Ambulatory Visit: Payer: BLUE CROSS/BLUE SHIELD | Admitting: Rheumatology

## 2018-03-30 NOTE — Telephone Encounter (Signed)
Patient called stating she is not sure if Dr. Corliss Skains wants her to make her follow-up appointment after she gets the MRI approved or before.  Patient was originally scheduled for today, but cancelled due to being in [place] with her granddaughter.

## 2018-03-30 NOTE — Telephone Encounter (Signed)
Attempted contact the patient and left message for patient to call the office.  

## 2018-04-02 ENCOUNTER — Telehealth: Payer: Self-pay | Admitting: Family Medicine

## 2018-04-02 NOTE — Telephone Encounter (Signed)
Called pt to reschedule NP appt and there is no availability until December. Pt stated she already had to reschedule once. Can pt be worked in? Please advise.

## 2018-04-02 NOTE — Telephone Encounter (Signed)
Left message to return call to our office. I need to talk to patient to move app up.

## 2018-04-02 NOTE — Telephone Encounter (Signed)
See note

## 2018-04-02 NOTE — Telephone Encounter (Signed)
Left message to return call to our office.  

## 2018-04-02 NOTE — Telephone Encounter (Signed)
Patient would like a call back from Bary Castilla to be rescheduled next available 06/02/2018.

## 2018-04-13 NOTE — Telephone Encounter (Signed)
Left message for patient to return call.

## 2018-04-19 ENCOUNTER — Telehealth: Payer: Self-pay | Admitting: Rheumatology

## 2018-04-19 DIAGNOSIS — M533 Sacrococcygeal disorders, not elsewhere classified: Principal | ICD-10-CM

## 2018-04-19 DIAGNOSIS — G8929 Other chronic pain: Secondary | ICD-10-CM

## 2018-04-19 DIAGNOSIS — Z1589 Genetic susceptibility to other disease: Secondary | ICD-10-CM

## 2018-04-19 NOTE — Telephone Encounter (Signed)
Patient called requesting a return call regarding her labwork results (specifically her Thyroid level) and CT.

## 2018-04-21 NOTE — Telephone Encounter (Signed)
Patient advised of TSH level. Patient advised that we have reorder MRI of sacrum.

## 2018-04-22 ENCOUNTER — Other Ambulatory Visit: Payer: Self-pay | Admitting: Women's Health

## 2018-04-28 ENCOUNTER — Encounter

## 2018-04-28 ENCOUNTER — Ambulatory Visit: Payer: BLUE CROSS/BLUE SHIELD | Admitting: Family Medicine

## 2018-05-02 ENCOUNTER — Ambulatory Visit (HOSPITAL_COMMUNITY)
Admission: RE | Admit: 2018-05-02 | Discharge: 2018-05-02 | Disposition: A | Discharge status: Home / Self Care | Payer: BLUE CROSS/BLUE SHIELD | Source: Ambulatory Visit | Attending: Rheumatology | Admitting: Rheumatology

## 2018-05-02 DIAGNOSIS — Z1589 Genetic susceptibility to other disease: Secondary | ICD-10-CM | POA: Diagnosis not present

## 2018-05-02 DIAGNOSIS — M533 Sacrococcygeal disorders, not elsewhere classified: Secondary | ICD-10-CM | POA: Diagnosis not present

## 2018-05-02 DIAGNOSIS — R600 Localized edema: Secondary | ICD-10-CM | POA: Diagnosis not present

## 2018-05-02 DIAGNOSIS — G8929 Other chronic pain: Secondary | ICD-10-CM

## 2018-05-03 NOTE — Progress Notes (Signed)
MRI of SI joints is consistent with osteoarthritis.  There is no evidence of sacroiliitis.

## 2018-05-10 ENCOUNTER — Encounter: Payer: Self-pay | Admitting: Primary Care

## 2018-05-10 ENCOUNTER — Ambulatory Visit: Payer: BLUE CROSS/BLUE SHIELD | Admitting: Primary Care

## 2018-05-10 VITALS — BP 150/104 | HR 74 | Temp 97.7°F | Ht 63.0 in | Wt 149.0 lb

## 2018-05-10 DIAGNOSIS — I10 Essential (primary) hypertension: Secondary | ICD-10-CM

## 2018-05-10 DIAGNOSIS — F32A Depression, unspecified: Secondary | ICD-10-CM | POA: Insufficient documentation

## 2018-05-10 DIAGNOSIS — R7989 Other specified abnormal findings of blood chemistry: Secondary | ICD-10-CM | POA: Diagnosis not present

## 2018-05-10 DIAGNOSIS — M19071 Primary osteoarthritis, right ankle and foot: Secondary | ICD-10-CM | POA: Diagnosis not present

## 2018-05-10 DIAGNOSIS — F419 Anxiety disorder, unspecified: Secondary | ICD-10-CM

## 2018-05-10 DIAGNOSIS — M19042 Primary osteoarthritis, left hand: Secondary | ICD-10-CM

## 2018-05-10 DIAGNOSIS — F329 Major depressive disorder, single episode, unspecified: Secondary | ICD-10-CM

## 2018-05-10 DIAGNOSIS — M19041 Primary osteoarthritis, right hand: Secondary | ICD-10-CM | POA: Diagnosis not present

## 2018-05-10 DIAGNOSIS — M19072 Primary osteoarthritis, left ankle and foot: Secondary | ICD-10-CM

## 2018-05-10 DIAGNOSIS — G8929 Other chronic pain: Secondary | ICD-10-CM

## 2018-05-10 DIAGNOSIS — M1712 Unilateral primary osteoarthritis, left knee: Secondary | ICD-10-CM

## 2018-05-10 DIAGNOSIS — M545 Low back pain: Secondary | ICD-10-CM

## 2018-05-10 MED ORDER — LISINOPRIL 20 MG PO TABS: 20.0000 mg | ORAL_TABLET | Freq: Every day | ORAL | 0 refills | Status: DC

## 2018-05-10 NOTE — Assessment & Plan Note (Signed)
Recent TSH of 0.3. Repeat TSH and Free T4 pending.

## 2018-05-10 NOTE — Assessment & Plan Note (Signed)
Overall no improvement on fluoxetine 10 mg BID per patient. We didn't have time today to get into details. We will discuss during next visit. Consider changing to Zoloft.

## 2018-05-10 NOTE — Assessment & Plan Note (Signed)
Following with rheumatology. Recent MRI suggestive of osteoarthritis to SI joints.

## 2018-05-10 NOTE — Progress Notes (Signed)
Subjective:    Patient ID: Christina [name], female    DOB: Dec 25, 1955, 62 y.o.   MRN: [id number]  HPI  Christina [name] is a 62 year old female who presents today to establish care and discuss the problems mentioned below. Will obtain old records.  1) Essential Hypertension: No formal diagnosis. History of elevated blood pressure readings since January 2019, has not been treated. Family history of hypertension in mother. She endorses fatigue, headaches, nausea.   She's been checking her BP at home and is getting readings ranging 150-190's/70's-90's.   BP Readings from Last 3 Encounters:  05/10/18 (!) 150/104  02/17/18 (!) 174/95  01/06/18 128/84   2) Abnormal TSH: Was notified by rheumatology of low TSH in August 2019. She as a family history of hypothyroidism in her mother and maternal grandmother. She denies palpitations, heat intolerance. She does endorse fatigue, cold intolerance, brittle nails.   3) Anxiety and Depression: Currently managed on fluoxetine 10 mg for which she's taking twice daily. Diagnosed in July 2019. Overall she hasn't noticed any significant improvement in symptoms which include daily worry, feeling nervous, difficulty sleeping, irritability. She denies SI/HI.   4) Osteoarthritis: Chronic pain ocated to entire body. Following with rheumatology who is completing a work up now. No formal diagnosis of anything at this point. She did undergo MRI of the sacral joints without evidence of sacroiliitis, suggestive of osteoarthritis.   Review of Systems  Respiratory: Negative for shortness of breath.   Cardiovascular: Negative for palpitations.  Gastrointestinal:       Intermittent IBS symptoms  Endocrine: Positive for cold intolerance. Negative for heat intolerance.  Musculoskeletal: Positive for arthralgias.  Psychiatric/Behavioral: Positive for sleep disturbance. The patient is nervous/anxious.        See HPI       Past Medical History:  Diagnosis Date  . Depression       Social History   Socioeconomic History  . Marital status: Married    Spouse name: Not on file  . Number of children: Not on file  . Years of education: Not on file  . Highest education level: Not on file  Occupational History  . Not on file  Social Needs  . Financial resource strain: Not on file  . Food insecurity:    Worry: Not on file    Inability: Not on file  . Transportation needs:    Medical: Not on file    Non-medical: Not on file  Tobacco Use  . Smoking status: Never Smoker  . Smokeless tobacco: Never Used  Substance and Sexual Activity  . Alcohol use: Yes    Alcohol/week: 0.0 standard drinks    Comment: daily   . Drug use: No  . Sexual activity: Yes    Birth control/protection: Surgical    Comment: FEB. 1980, 4 PARTNERS.  Lifestyle  . Physical activity:    Days per week: Not on file    Minutes per session: Not on file  . Stress: Not on file  Relationships  . Social connections:    Talks on phone: Not on file    Gets together: Not on file    Attends religious service: Not on file    Active member of club or organization: Not on file    Attends meetings of clubs or organizations: Not on file    Relationship status: Not on file  . Intimate partner violence:    Fear of current or ex partner: Not on file    Emotionally  abused: Not on file    Physically abused: Not on file    Forced sexual activity: Not on file  Other Topics Concern  . Not on file  Social History Narrative  . Not on file    Past Surgical History:  Procedure Laterality Date  . CARPAL TUNNEL RELEASE Bilateral 05/21/2013   BILATERAL CARPAL TUNNEL SURGERY  . CESAREAN SECTION  1990  . CESAREAN SECTION  1992  . VAGINAL HYSTERECTOMY      Family History  Problem Relation Age of Onset  . Cancer Mother        brain  . Ovarian cancer Mother   . Cancer Father        prostate  . Breast cancer Maternal Grandmother   . Ovarian cancer Maternal Grandmother   . Heart Problems Brother    . Healthy Daughter   . Healthy Daughter   . Healthy Son   . Healthy Son     Allergies  Allergen Reactions  . Toradol [Ketorolac Tromethamine]     Nausea, vomiting  . Ultram [Tramadol]     Nausea     Current Outpatient Medications on File Prior to Visit  Medication Sig Dispense Refill  . esomeprazole (NEXIUM) 40 MG capsule Take 40 mg by mouth daily at 12 noon.    Marland Kitchen FLUoxetine (PROZAC) 10 MG capsule TAKE 1 CAPSULE BY MOUTH DAILY. (Patient taking differently: Take 10 mg by mouth 2 (two) times daily. ) 30 capsule 2  . Loperamide HCl (IMODIUM PO) Take 2 mg by mouth as needed.    . Multiple Vitamins-Minerals (ONE-A-DAY WOMENS 50+ ADVANTAGE PO) Take by mouth.    . ondansetron (ZOFRAN-ODT) 4 MG disintegrating tablet Take 4 mg by mouth as needed.   1  . OVER THE COUNTER MEDICATION     . valACYclovir (VALTREX) 500 MG tablet Take twice daily for 3-5 days as needed (Patient taking differently: Take 500 mg by mouth as needed. ) 30 tablet 12   No current facility-administered medications on file prior to visit.     BP (!) 150/104   Pulse 74   Temp 97.7 F (36.5 C) (Oral)   Ht 5\' 3"  (1.6 m)   Wt 149 lb (67.6 kg)   SpO2 98%   BMI 26.39 kg/m    Objective:   Physical Exam  Constitutional: She appears well-nourished.  Neck: Neck supple.  Cardiovascular: Normal rate and regular rhythm.  Respiratory: Effort normal and breath sounds normal.  Skin: Skin is warm and dry.  Psychiatric: She has a normal mood and affect.           Assessment & Plan:

## 2018-05-10 NOTE — Assessment & Plan Note (Signed)
Following with rheumatology. Rheumatoid arthritis work up appears negative.

## 2018-05-10 NOTE — Assessment & Plan Note (Signed)
Following with rheumatology

## 2018-05-10 NOTE — Patient Instructions (Signed)
Start lisinopril 20 mg tablets for high blood pressure. Take 1 tablet by mouth once daily.  Start monitoring your blood pressure daily, around the same time of day, for the next 2-3 weeks.  Ensure that you have rested for 30 minutes prior to checking your blood pressure. Record your readings and bring them to your next visit.  Stop by the lab prior to leaving today. I will notify you of your results once received.   Stop using Afrin. Try an antihistamine such as Claritin, Allegra, Zyrtec. You can also try fluticasone (Flonase) nasal spray. Instill 1 spray in each nostril twice daily.   Schedule a follow up visit in 2-3 weeks for blood pressure check.  It was a pleasure to meet you today! Please don't hesitate to call or message me with any questions. Welcome to [facility]!

## 2018-05-10 NOTE — Assessment & Plan Note (Signed)
Above goal in the office today, also on other readings, and home readings. Rx for lisinopril 20 mg sent to pharmacy.  Will have her monitor BP at home, follow up in 2-3 weeks for BP check. Repeat BMP at that time.

## 2018-05-11 LAB — COMPREHENSIVE METABOLIC PANEL
ALBUMIN: 4.5 g/dL (ref 3.5–5.2)
ALK PHOS: 73 U/L (ref 39–117)
ALT: 20 U/L (ref 0–35)
AST: 26 U/L (ref 0–37)
BILIRUBIN TOTAL: 0.6 mg/dL (ref 0.2–1.2)
BUN: 11 mg/dL (ref 6–23)
CO2: 26 mEq/L (ref 19–32)
Calcium: 9.4 mg/dL (ref 8.4–10.5)
Chloride: 98 mEq/L (ref 96–112)
Creatinine, Ser: 0.72 mg/dL (ref 0.40–1.20)
GFR: 87.52 mL/min (ref >60.00)
GLUCOSE: 89 mg/dL (ref 70–99)
POTASSIUM: 3.8 meq/L (ref 3.5–5.1)
SODIUM: 136 meq/L (ref 135–145)
TOTAL PROTEIN: 7.3 g/dL (ref 6.0–8.3)

## 2018-05-11 LAB — TSH: TSH: 1.42 u[IU]/mL (ref 0.35–4.50)

## 2018-05-31 ENCOUNTER — Ambulatory Visit: Payer: BLUE CROSS/BLUE SHIELD | Admitting: Primary Care

## 2018-06-09 ENCOUNTER — Ambulatory Visit: Payer: BLUE CROSS/BLUE SHIELD | Admitting: Primary Care

## 2018-06-14 ENCOUNTER — Ambulatory Visit: Payer: BLUE CROSS/BLUE SHIELD | Admitting: Primary Care

## 2018-06-14 ENCOUNTER — Encounter: Payer: Self-pay | Admitting: Primary Care

## 2018-06-14 VITALS — BP 118/72 | HR 83 | Temp 98.2°F | Ht 63.0 in | Wt 141.8 lb

## 2018-06-14 DIAGNOSIS — F329 Major depressive disorder, single episode, unspecified: Secondary | ICD-10-CM | POA: Diagnosis not present

## 2018-06-14 DIAGNOSIS — F32A Depression, unspecified: Secondary | ICD-10-CM

## 2018-06-14 DIAGNOSIS — I10 Essential (primary) hypertension: Secondary | ICD-10-CM

## 2018-06-14 DIAGNOSIS — F419 Anxiety disorder, unspecified: Secondary | ICD-10-CM

## 2018-06-14 DIAGNOSIS — G8929 Other chronic pain: Secondary | ICD-10-CM

## 2018-06-14 DIAGNOSIS — M545 Low back pain, unspecified: Secondary | ICD-10-CM

## 2018-06-14 DIAGNOSIS — K589 Irritable bowel syndrome without diarrhea: Secondary | ICD-10-CM | POA: Insufficient documentation

## 2018-06-14 MED ORDER — DULOXETINE HCL 30 MG PO CPEP: 30.0000 mg | ORAL_CAPSULE | Freq: Every day | ORAL | 1 refills | Status: DC

## 2018-06-14 MED ORDER — DICYCLOMINE HCL 10 MG PO CAPS: 10.0000 mg | ORAL_CAPSULE | Freq: Three times a day (TID) | ORAL | 0 refills | Status: DC

## 2018-06-14 MED ORDER — LISINOPRIL 20 MG PO TABS: 20.0000 mg | ORAL_TABLET | Freq: Every day | ORAL | 3 refills | Status: DC

## 2018-06-14 NOTE — Assessment & Plan Note (Signed)
GI symptoms representative of IBS.  Especially in light of negative GI work-up in the past. Will stop ondansetron, trial of dicyclomine sent to pharmacy.  Directions provided. She will update.

## 2018-06-14 NOTE — Assessment & Plan Note (Signed)
Improved on lisinopril 20 mg.  Continue same. BMP pending.  Refill sent to pharmacy.

## 2018-06-14 NOTE — Assessment & Plan Note (Addendum)
Uncontrolled on fluoxetine 20 mg.  PHQ 9 score of 20 and GAD 7 score 15 today. Discussed different options for treatment, given that she has not responded to fluoxetine we will stop fluoxetine and switch regimens. Prescription for Cymbalta 30 mg sent to pharmacy to help with anxiety, depression, and potential fibromyalgia type pain.  She also agrees to therapy, referral placed.  We discussed possible side effects of headache, GI upset, drowsiness, and SI/HI. If thoughts of SI/HI develop, we discussed to present to the emergency immediately. Patient verbalized understanding.   Follow up in 6 weeks for re-evaluation.

## 2018-06-14 NOTE — Progress Notes (Signed)
Subjective:    Patient ID: Christina [name], female    DOB: 04/27/1956, 62 y.o.   MRN: [id number]  HPI  Christina [name] is a 62 year old female who presents today for follow up.  1) Essential Hypertension: She was last evaluated on 05/10/18 with elevated blood pressure readings in the office and at home without treatment. Given elevated readings she was provided with a prescription for lisinopril 20 mg.   Since her last visit she's been checking her blood pressure once daily and is getting readings of 110's to 120s/70's.  She denies cough, chest pain, dizziness.  BP Readings from Last 3 Encounters:  06/14/18 118/72  05/10/18 (!) 150/104  02/17/18 (!) 174/95   2) Chronic Nausea/IBS: Previously following with GI for chronic nausea and vomiting who ruled out acute GI cause. She is currently following with rheumatology and is undergoing work-up testing for fibromyalgia. She was told that her GI symptoms could be IBS during "fibromyalgia flares".  She was prescribed ondansetron earlier in 2019 for symptoms of nausea.  During her "flares" she will take her ondansetron and Imodium with improvement in symptoms.  She's been managed on ondansetron for about one year. She has never been treated with IBS medication.   Flares include symptoms of nausea, some vomiting, diarrhea, fatigue. She will experience flares once monthly, lasting 3-5 days on average.  She is requesting a refill of her ondansetron today.  3) Anxiety and Depression: Diagnosed in July 2019. Currently managed on fluoxetine 10 mg, taking 20 mg for which she's been doing since August 2019. Symptoms include irritability, doesn't care about things, feeling down/sad, fatigue, difficulty sleeping. PHQ 9 score of 20 and GAD 7 score of 15 today. She denies SI/HI.  She has not tried anything else for anxiety treatment other than fluoxetine.  She has never felt that the fluoxetine has helped with symptoms.  Review of Systems  Constitutional:   Intermittent fatigue  Eyes: Negative for visual disturbance.  Respiratory: Negative for cough and shortness of breath.   Cardiovascular: Negative for chest pain.  Gastrointestinal:       Intermittent nausea, diarrhea.  Musculoskeletal: Positive for arthralgias.  Neurological: Negative for dizziness and headaches.  Psychiatric/Behavioral: Positive for sleep disturbance. Negative for suicidal ideas. The patient is nervous/anxious.        See HPI.       Past Medical History:  Diagnosis Date  . Depression   . GAD (generalized anxiety disorder)   . Genital herpes   . Osteoarthritis   . Seasonal allergies      Social History   Socioeconomic History  . Marital status: Married    Spouse name: Not on file  . Number of children: Not on file  . Years of education: Not on file  . Highest education level: Not on file  Occupational History  . Not on file  Social Needs  . Financial resource strain: Not on file  . Food insecurity:    Worry: Not on file    Inability: Not on file  . Transportation needs:    Medical: Not on file    Non-medical: Not on file  Tobacco Use  . Smoking status: Never Smoker  . Smokeless tobacco: Never Used  Substance and Sexual Activity  . Alcohol use: Yes    Alcohol/week: 0.0 standard drinks    Comment: daily   . Drug use: No  . Sexual activity: Yes    Birth control/protection: Surgical    Comment: FEB. 1980,  4 PARTNERS.  Lifestyle  . Physical activity:    Days per week: Not on file    Minutes per session: Not on file  . Stress: Not on file  Relationships  . Social connections:    Talks on phone: Not on file    Gets together: Not on file    Attends religious service: Not on file    Active member of club or organization: Not on file    Attends meetings of clubs or organizations: Not on file    Relationship status: Not on file  . Intimate partner violence:    Fear of current or ex partner: Not on file    Emotionally abused: Not on file     Physically abused: Not on file    Forced sexual activity: Not on file  Other Topics Concern  . Not on file  Social History Narrative   Married.   2 children.   Works as Arts development officer.     Past Surgical History:  Procedure Laterality Date  . CARPAL TUNNEL RELEASE Bilateral 05/21/2013   BILATERAL CARPAL TUNNEL SURGERY  . CESAREAN SECTION  1990  . CESAREAN SECTION  1992  . VAGINAL HYSTERECTOMY      Family History  Problem Relation Age of Onset  . Cancer Mother        brain  . Ovarian cancer Mother   . Cancer Father        prostate  . Breast cancer Maternal Grandmother   . Ovarian cancer Maternal Grandmother   . Heart Problems Brother   . Healthy Daughter   . Healthy Daughter   . Healthy Son   . Healthy Son     Allergies  Allergen Reactions  . Toradol [Ketorolac Tromethamine]     Nausea, vomiting  . Ultram [Tramadol]     Nausea     Current Outpatient Medications on File Prior to Visit  Medication Sig Dispense Refill  . esomeprazole (NEXIUM) 40 MG capsule Take 40 mg by mouth daily at 12 noon.    . Loperamide HCl (IMODIUM PO) Take 2 mg by mouth as needed.    . Multiple Vitamins-Minerals (ONE-A-DAY WOMENS 50+ ADVANTAGE PO) Take by mouth.    . ondansetron (ZOFRAN-ODT) 4 MG disintegrating tablet Take 4 mg by mouth as needed.   1  . OVER THE COUNTER MEDICATION     . valACYclovir (VALTREX) 500 MG tablet Take twice daily for 3-5 days as needed (Patient taking differently: Take 500 mg by mouth as needed. ) 30 tablet 12   No current facility-administered medications on file prior to visit.     BP 118/72   Pulse 83   Temp 98.2 F (36.8 C) (Oral)   Ht 5\' 3"  (1.6 m)   Wt 141 lb 12 oz (64.3 kg)   SpO2 94%   BMI 25.11 kg/m    Objective:   Physical Exam  Constitutional: She appears well-nourished.  Neck: Neck supple.  Cardiovascular: Normal rate and regular rhythm.  Respiratory: Effort normal and breath sounds normal.  Skin: Skin is warm and dry.  Psychiatric: She  has a normal mood and affect.           Assessment & Plan:

## 2018-06-14 NOTE — Assessment & Plan Note (Signed)
Following with rheumatology and undergoing work-up for potential fibromyalgia. Will trial Cymbalta 30 mg for anxiety depression, also for potential fibromyalgia type pain. Follow-up in 6 weeks.

## 2018-06-14 NOTE — Patient Instructions (Signed)
Stop by the lab prior to leaving today. I will notify you of your results once received.   Continue taking lisinopril 20 mg tablets for blood pressure.  You can try the dicyclomine 10 mg capsules before meals as needed for diarrhea/nausea symptoms.  Stop fluoxetine, start duloxetine (Cymbalta) 30 mg daily for anxiety and depression. Take 1 tablet by mouth every morning with food.  Schedule a follow up visit in 6 weeks for anxiety and depression follow up. Call me sooner if you have any problems.  It was a pleasure to see you today!

## 2018-06-15 LAB — BASIC METABOLIC PANEL
BUN: 7 mg/dL (ref 6–23)
CO2: 28 mEq/L (ref 19–32)
Calcium: 9.5 mg/dL (ref 8.4–10.5)
Chloride: 103 mEq/L (ref 96–112)
Creatinine, Ser: 0.66 mg/dL (ref 0.40–1.20)
GFR: 96.74 mL/min (ref >60.00)
Glucose, Bld: 79 mg/dL (ref 70–99)
Potassium: 4.7 mEq/L (ref 3.5–5.1)
SODIUM: 139 meq/L (ref 135–145)

## 2018-06-18 DIAGNOSIS — H00021 Hordeolum internum right upper eyelid: Secondary | ICD-10-CM | POA: Diagnosis not present

## 2018-06-22 DIAGNOSIS — H00021 Hordeolum internum right upper eyelid: Secondary | ICD-10-CM | POA: Diagnosis not present

## 2018-07-01 ENCOUNTER — Encounter: Payer: Self-pay | Admitting: Internal Medicine

## 2018-07-01 ENCOUNTER — Ambulatory Visit: Payer: BLUE CROSS/BLUE SHIELD | Admitting: Internal Medicine

## 2018-07-01 ENCOUNTER — Ambulatory Visit (INDEPENDENT_AMBULATORY_CARE_PROVIDER_SITE_OTHER)
Admission: RE | Admit: 2018-07-01 | Discharge: 2018-07-01 | Disposition: A | Discharge status: Home / Self Care | Payer: BLUE CROSS/BLUE SHIELD | Source: Ambulatory Visit | Attending: Internal Medicine | Admitting: Internal Medicine

## 2018-07-01 VITALS — BP 132/88 | HR 90 | Temp 98.5°F | Resp 18 | Ht 63.0 in | Wt 140.0 lb

## 2018-07-01 DIAGNOSIS — R079 Chest pain, unspecified: Secondary | ICD-10-CM | POA: Diagnosis not present

## 2018-07-01 DIAGNOSIS — R05 Cough: Secondary | ICD-10-CM | POA: Diagnosis not present

## 2018-07-01 DIAGNOSIS — R059 Cough, unspecified: Secondary | ICD-10-CM

## 2018-07-01 MED ORDER — AMOXICILLIN 500 MG PO TABS: 1000.0000 mg | ORAL_TABLET | Freq: Two times a day (BID) | ORAL | 0 refills | Status: AC

## 2018-07-01 NOTE — Assessment & Plan Note (Addendum)
Clearly seems to have sinusitis and early OM Pleuritic chest pain and fever Will check CXR to determine best Rx CXR normal (my reading) Will treat with amoxicillin

## 2018-07-01 NOTE — Progress Notes (Signed)
Subjective:    Patient ID: Christina [name], female    DOB: Aug 01, 1955, 63 y.o.   MRN: [id number]  HPI Here with husband due to respiratory illness  Started 5 days ago Sore throat, body aches and headache Several ill family members---no flu by testing Now worse---pain in right shoulder blade (pleuritic) Green nasal discharge Cough with green sputum  Fever--better with analgesics. As high as 102 Chills at night--no sweats Some DOE--no energy and tough just walking across the room  Alka seltzer cold also---helps some  Current Outpatient Medications on File Prior to Visit  Medication Sig Dispense Refill  . DULoxetine (CYMBALTA) 30 MG capsule Take 1 capsule (30 mg total) by mouth daily. For anxiety and depression. 30 capsule 1  . esomeprazole (NEXIUM) 40 MG capsule Take 40 mg by mouth daily at 12 noon.    Marland Kitchen lisinopril (PRINIVIL,ZESTRIL) 20 MG tablet Take 1 tablet (20 mg total) by mouth daily. For blood pressure. 90 tablet 3  . Loperamide HCl (IMODIUM PO) Take 2 mg by mouth as needed.    . valACYclovir (VALTREX) 500 MG tablet Take twice daily for 3-5 days as needed (Patient taking differently: Take 500 mg by mouth as needed. ) 30 tablet 12  . dicyclomine (BENTYL) 10 MG capsule Take 1 capsule (10 mg total) by mouth 4 (four) times daily -  before meals and at bedtime. As needed. (Patient not taking: Reported on 07/01/2018) 30 capsule 0  . Multiple Vitamins-Minerals (ONE-A-DAY WOMENS 50+ ADVANTAGE PO) Take by mouth.    . ondansetron (ZOFRAN-ODT) 4 MG disintegrating tablet Take 4 mg by mouth as needed.   1  . OVER THE COUNTER MEDICATION      No current facility-administered medications on file prior to visit.     Allergies  Allergen Reactions  . Toradol [Ketorolac Tromethamine]     Nausea, vomiting  . Ultram [Tramadol]     Nausea     Past Medical History:  Diagnosis Date  . Depression   . GAD (generalized anxiety disorder)   . Genital herpes   . Osteoarthritis   . Seasonal  allergies     Past Surgical History:  Procedure Laterality Date  . CARPAL TUNNEL RELEASE Bilateral 05/21/2013   BILATERAL CARPAL TUNNEL SURGERY  . CESAREAN SECTION  1990  . CESAREAN SECTION  1992  . VAGINAL HYSTERECTOMY      Family History  Problem Relation Age of Onset  . Cancer Mother        brain  . Ovarian cancer Mother   . Cancer Father        prostate  . Breast cancer Maternal Grandmother   . Ovarian cancer Maternal Grandmother   . Heart Problems Brother   . Healthy Daughter   . Healthy Daughter   . Healthy Son   . Healthy Son     Social History   Socioeconomic History  . Marital status: Married    Spouse name: Not on file  . Number of children: Not on file  . Years of education: Not on file  . Highest education level: Not on file  Occupational History  . Not on file  Social Needs  . Financial resource strain: Not on file  . Food insecurity:    Worry: Not on file    Inability: Not on file  . Transportation needs:    Medical: Not on file    Non-medical: Not on file  Tobacco Use  . Smoking status: Never Smoker  . Smokeless  tobacco: Never Used  Substance and Sexual Activity  . Alcohol use: Yes    Alcohol/week: 0.0 standard drinks    Comment: daily   . Drug use: No  . Sexual activity: Yes    Birth control/protection: Surgical    Comment: FEB. 1980, 4 PARTNERS.  Lifestyle  . Physical activity:    Days per week: Not on file    Minutes per session: Not on file  . Stress: Not on file  Relationships  . Social connections:    Talks on phone: Not on file    Gets together: Not on file    Attends religious service: Not on file    Active member of club or organization: Not on file    Attends meetings of clubs or organizations: Not on file    Relationship status: Not on file  . Intimate partner violence:    Fear of current or ex partner: Not on file    Emotionally abused: Not on file    Physically abused: Not on file    Forced sexual activity: Not on  file  Other Topics Concern  . Not on file  Social History Narrative   Married.   2 children.   Works as Arts development officer.    Review of Systems No vomiting Chronic diarrhea---but not recently until this started Some concern about fibromyalgia---may be IBS component Appetite is off    Objective:   Physical Exam  Constitutional: She appears well-developed. No distress.  Mildly uncomfortable  HENT:  Mouth/Throat: Oropharynx is clear and moist. No oropharyngeal exudate.  Marked maxillary tenderness Redness in superior portion of right TM Left TM normal Moderate nasal inflammation  Neck: No thyromegaly present.  Non tender upper anterior cervical nodes  Respiratory: Effort normal and breath sounds normal. No respiratory distress. She has no wheezes. She has no rales.  No dullness           Assessment & Plan:

## 2018-07-06 ENCOUNTER — Telehealth: Payer: Self-pay | Admitting: *Deleted

## 2018-07-06 ENCOUNTER — Other Ambulatory Visit: Payer: Self-pay | Admitting: Women's Health

## 2018-07-06 MED ORDER — ACYCLOVIR 5 % EX OINT: 1.0000 "application " | TOPICAL_OINTMENT | CUTANEOUS | 2 refills | Status: DC

## 2018-07-06 NOTE — Telephone Encounter (Signed)
Patient called requesting refill on Zovirax 5% ointment, last filled in 2018. Rx sent.

## 2018-07-26 ENCOUNTER — Ambulatory Visit: Payer: BLUE CROSS/BLUE SHIELD | Admitting: Primary Care

## 2018-07-26 ENCOUNTER — Encounter: Payer: Self-pay | Admitting: Primary Care

## 2018-07-26 VITALS — BP 122/74 | HR 98 | Temp 98.2°F | Ht 63.0 in | Wt 148.8 lb

## 2018-07-26 DIAGNOSIS — F329 Major depressive disorder, single episode, unspecified: Secondary | ICD-10-CM

## 2018-07-26 DIAGNOSIS — E559 Vitamin D deficiency, unspecified: Secondary | ICD-10-CM | POA: Diagnosis not present

## 2018-07-26 DIAGNOSIS — R5383 Other fatigue: Secondary | ICD-10-CM

## 2018-07-26 DIAGNOSIS — F419 Anxiety disorder, unspecified: Secondary | ICD-10-CM | POA: Diagnosis not present

## 2018-07-26 DIAGNOSIS — F32A Depression, unspecified: Secondary | ICD-10-CM

## 2018-07-26 MED ORDER — DULOXETINE HCL 60 MG PO CPEP: 60.0000 mg | ORAL_CAPSULE | Freq: Every day | ORAL | 0 refills | Status: DC

## 2018-07-26 NOTE — Assessment & Plan Note (Signed)
Repeat labs pending. Compliant to vitamin D daily.

## 2018-07-26 NOTE — Patient Instructions (Signed)
We've increased the dose of your duloxetine (Cymbalta) to 60 mg. Take 1 capsule by mouth once daily.  I will have our team work on your referral to therapy. Please message me in a few days if you don't hear from anyone.  Stop by the lab prior to leaving today. I will notify you of your results once received.   Please message me with an update in 4-6 weeks as discussed.  It was a pleasure to see you today!

## 2018-07-26 NOTE — Progress Notes (Signed)
Subjective:    Patient ID: Christina [name], female    DOB: 09/25/1955, 63 y.o.   MRN: [id number]  HPI  [name] is a 63 year old female who presents today for follow up of anxiety and depression.  She was last evaluated by me on 06/14/18 with symptoms of irritability, feeling down/sad, fatigue, difficulty sleeping. PHQ 9 score of 20 and GAD 7 score of 15. She didn't feel like fluoxetine was helping despite dose increases so her regimen was changed to duloxetine 30 mg.   Since her last visit she's feeling better. Positive effects include being more interested in activities she used to enjoy, she is caring more about overall health, feeling less sad. She continues to feel fatigued and still has some of her prior symptoms. She didn't start her duloxetine right away as she was reticent to start. She ended up starting the week of Christmas.   PHQ 9 score of 20 and GAD 7 score of 15 today.   Review of Systems  Respiratory: Negative for shortness of breath.   Cardiovascular: Negative for chest pain.  Gastrointestinal: Negative for nausea.  Neurological: Negative for dizziness.  Psychiatric/Behavioral: Positive for sleep disturbance. Negative for suicidal ideas.       See HPI       Past Medical History:  Diagnosis Date  . Depression   . GAD (generalized anxiety disorder)   . Genital herpes   . Osteoarthritis   . Seasonal allergies      Social History   Socioeconomic History  . Marital status: Married    Spouse name: Not on file  . Number of children: Not on file  . Years of education: Not on file  . Highest education level: Not on file  Occupational History  . Not on file  Social Needs  . Financial resource strain: Not on file  . Food insecurity:    Worry: Not on file    Inability: Not on file  . Transportation needs:    Medical: Not on file    Non-medical: Not on file  Tobacco Use  . Smoking status: Never Smoker  . Smokeless tobacco: Never Used  Substance and Sexual  Activity  . Alcohol use: Yes    Alcohol/week: 0.0 standard drinks    Comment: daily   . Drug use: No  . Sexual activity: Yes    Birth control/protection: Surgical    Comment: FEB. 1980, 4 PARTNERS.  Lifestyle  . Physical activity:    Days per week: Not on file    Minutes per session: Not on file  . Stress: Not on file  Relationships  . Social connections:    Talks on phone: Not on file    Gets together: Not on file    Attends religious service: Not on file    Active member of club or organization: Not on file    Attends meetings of clubs or organizations: Not on file    Relationship status: Not on file  . Intimate partner violence:    Fear of current or ex partner: Not on file    Emotionally abused: Not on file    Physically abused: Not on file    Forced sexual activity: Not on file  Other Topics Concern  . Not on file  Social History Narrative   Married.   2 children.   Works as Arts development officerhome maker.     Past Surgical History:  Procedure Laterality Date  . CARPAL TUNNEL RELEASE Bilateral 05/21/2013  BILATERAL CARPAL TUNNEL SURGERY  . CESAREAN SECTION  1990  . CESAREAN SECTION  1992  . VAGINAL HYSTERECTOMY      Family History  Problem Relation Age of Onset  . Cancer Mother        brain  . Ovarian cancer Mother   . Cancer Father        prostate  . Breast cancer Maternal Grandmother   . Ovarian cancer Maternal Grandmother   . Heart Problems Brother   . Healthy Daughter   . Healthy Daughter   . Healthy Son   . Healthy Son     Allergies  Allergen Reactions  . Toradol [Ketorolac Tromethamine]     Nausea, vomiting  . Ultram [Tramadol]     Nausea     Current Outpatient Medications on File Prior to Visit  Medication Sig Dispense Refill  . acyclovir ointment (ZOVIRAX) 5 % Apply 1 application topically every 3 (three) hours. 30 g 2  . dicyclomine (BENTYL) 10 MG capsule Take 1 capsule (10 mg total) by mouth 4 (four) times daily -  before meals and at bedtime. As  needed. 30 capsule 0  . lisinopril (PRINIVIL,ZESTRIL) 20 MG tablet Take 1 tablet (20 mg total) by mouth daily. For blood pressure. 90 tablet 3  . Loperamide HCl (IMODIUM PO) Take 2 mg by mouth as needed.    . Multiple Vitamins-Minerals (ONE-A-DAY WOMENS 50+ ADVANTAGE PO) Take by mouth.    . ondansetron (ZOFRAN-ODT) 4 MG disintegrating tablet Take 4 mg by mouth as needed.   1  . OVER THE COUNTER MEDICATION     . valACYclovir (VALTREX) 500 MG tablet Take twice daily for 3-5 days as needed (Patient taking differently: Take 500 mg by mouth as needed. ) 30 tablet 12   No current facility-administered medications on file prior to visit.     BP 122/74   Pulse 98   Temp 98.2 F (36.8 C) (Oral)   Ht 5\' 3"  (1.6 m)   Wt 148 lb 12 oz (67.5 kg)   SpO2 97%   BMI 26.35 kg/m    Objective:   Physical Exam  Constitutional: She appears well-nourished.  Neck: Neck supple.  Cardiovascular: Normal rate and regular rhythm.  Respiratory: Effort normal and breath sounds normal.  Skin: Skin is warm and dry.  Psychiatric: She has a normal mood and affect.           Assessment & Plan:

## 2018-07-26 NOTE — Assessment & Plan Note (Signed)
Seems improved, however PHQ 9 score and GAD 7 score are the same. Given improvement will trial an increase to 60 mg.  Rx sent to pharmacy. She will message us in 4-6 weeks with an update.  Denies SI/HI.

## 2018-07-26 NOTE — Assessment & Plan Note (Addendum)
Chronic. TSH unremarkable in late 2019. Check B12 levels. She is taking vitamin B12 daily. Could be secondary to depression/anxiety which we are working to treat.

## 2018-07-27 LAB — VITAMIN D 25 HYDROXY (VIT D DEFICIENCY, FRACTURES): VITD: 30.02 ng/mL (ref 30.00–100.00)

## 2018-07-27 LAB — VITAMIN B12: Vitamin B-12: 651 pg/mL (ref 211–911)

## 2018-08-18 ENCOUNTER — Encounter: Payer: Self-pay | Admitting: Women's Health

## 2018-08-18 DIAGNOSIS — Z1231 Encounter for screening mammogram for malignant neoplasm of breast: Secondary | ICD-10-CM | POA: Diagnosis not present

## 2018-08-18 DIAGNOSIS — Z803 Family history of malignant neoplasm of breast: Secondary | ICD-10-CM | POA: Diagnosis not present

## 2018-08-25 NOTE — Telephone Encounter (Signed)
Christina [name], will you please get her in with me either tomorrow or Friday? Thanks!

## 2018-08-26 NOTE — Telephone Encounter (Signed)
Christina [name], will you put her on my schedule for 12 pm Friday Feb 28th? If you can't create at 12 pm then okay to put her on at 2 pm, she knows to come in at 12 pm.

## 2018-08-27 ENCOUNTER — Encounter: Payer: Self-pay | Admitting: Primary Care

## 2018-08-27 ENCOUNTER — Ambulatory Visit: Payer: BLUE CROSS/BLUE SHIELD | Admitting: Primary Care

## 2018-08-27 VITALS — BP 158/98 | HR 75 | Temp 98.2°F | Ht 63.0 in | Wt 148.5 lb

## 2018-08-27 DIAGNOSIS — R51 Headache: Secondary | ICD-10-CM | POA: Diagnosis not present

## 2018-08-27 DIAGNOSIS — I10 Essential (primary) hypertension: Secondary | ICD-10-CM | POA: Diagnosis not present

## 2018-08-27 DIAGNOSIS — R519 Headache, unspecified: Secondary | ICD-10-CM

## 2018-08-27 MED ORDER — LISINOPRIL 30 MG PO TABS: 30.0000 mg | ORAL_TABLET | Freq: Every day | ORAL | 0 refills | Status: DC

## 2018-08-27 MED ORDER — METHYLPREDNISOLONE ACETATE 80 MG/ML IJ SUSP
80.0000 mg | Freq: Once | INTRAMUSCULAR | Status: AC
  Administered 2018-08-27: 80 mg via INTRAMUSCULAR

## 2018-08-27 NOTE — Assessment & Plan Note (Signed)
Above goal today, also with home readings. Highly suspect this due to anxiety due to stress at home. Also likely to be causing headaches. We will continue with stress reducing methods. Also increase lisinopril to 30 mg daily for now, reduce down when appropriate. She will continue to monitor BP.  IM Depo Medrol 80 mg provided today for headache.   She will update in 1-2 weeks.

## 2018-08-27 NOTE — Progress Notes (Signed)
Subjective:    Patient ID: Christina [name], female    DOB: 1956/06/14, 63 y.o.   MRN: [id number]  HPI  Christina [name] is a 63 year old female with a history of anxiety/depression, hypertension who presents today with a chief complaint of elevated blood pressure readings.   She was last evaluated on 07/26/18 for follow up of anxiety and depression. She endorsed feeling better since switched to duloxetine 30 mg, dose increased to 60 mg.   BP Readings from Last 3 Encounters:  08/27/18 (!) 158/98  07/26/18 122/74  07/01/18 132/88   She sent a few messages via My Chart with recent elevated blood pressure readings, also with increased anxiety due to personal stress. She endorsed compliance to lisinopril 20 mg.   She's been checking her BP for the last three days which is ranging 150-190/70's-100's. She started checking her BP due to persistent headaches that began 08/13/18. She's been taking Aleve, Tylenol, Excedrin Migraine without improvement. She will be meeting with her therapist next week, is doing well on duloxetine 60 mg.   Review of Systems  Respiratory: Negative for shortness of breath.   Cardiovascular: Negative for chest pain.  Neurological: Positive for headaches. Negative for dizziness.  Psychiatric/Behavioral: The patient is nervous/anxious.        Past Medical History:  Diagnosis Date  . Depression   . GAD (generalized anxiety disorder)   . Genital herpes   . Osteoarthritis   . Seasonal allergies      Social History   Socioeconomic History  . Marital status: Married    Spouse name: Not on file  . Number of children: Not on file  . Years of education: Not on file  . Highest education level: Not on file  Occupational History  . Not on file  Social Needs  . Financial resource strain: Not on file  . Food insecurity:    Worry: Not on file    Inability: Not on file  . Transportation needs:    Medical: Not on file    Non-medical: Not on file  Tobacco Use  . Smoking  status: Never Smoker  . Smokeless tobacco: Never Used  Substance and Sexual Activity  . Alcohol use: Yes    Alcohol/week: 0.0 standard drinks    Comment: daily   . Drug use: No  . Sexual activity: Yes    Birth control/protection: Surgical    Comment: FEB. 1980, 4 PARTNERS.  Lifestyle  . Physical activity:    Days per week: Not on file    Minutes per session: Not on file  . Stress: Not on file  Relationships  . Social connections:    Talks on phone: Not on file    Gets together: Not on file    Attends religious service: Not on file    Active member of club or organization: Not on file    Attends meetings of clubs or organizations: Not on file    Relationship status: Not on file  . Intimate partner violence:    Fear of current or ex partner: Not on file    Emotionally abused: Not on file    Physically abused: Not on file    Forced sexual activity: Not on file  Other Topics Concern  . Not on file  Social History Narrative   Married.   2 children.   Works as Arts development officer.     Past Surgical History:  Procedure Laterality Date  . CARPAL TUNNEL RELEASE Bilateral 05/21/2013  BILATERAL CARPAL TUNNEL SURGERY  . CESAREAN SECTION  1990  . CESAREAN SECTION  1992  . VAGINAL HYSTERECTOMY      Family History  Problem Relation Age of Onset  . Cancer Mother        brain  . Ovarian cancer Mother   . Cancer Father        prostate  . Breast cancer Maternal Grandmother   . Ovarian cancer Maternal Grandmother   . Heart Problems Brother   . Healthy Daughter   . Healthy Daughter   . Healthy Son   . Healthy Son     Allergies  Allergen Reactions  . Toradol [Ketorolac Tromethamine]     Nausea, vomiting  . Ultram [Tramadol]     Nausea     Current Outpatient Medications on File Prior to Visit  Medication Sig Dispense Refill  . acyclovir ointment (ZOVIRAX) 5 % Apply 1 application topically every 3 (three) hours. 30 g 2  . dicyclomine (BENTYL) 10 MG capsule Take 1 capsule (10  mg total) by mouth 4 (four) times daily -  before meals and at bedtime. As needed. 30 capsule 0  . DULoxetine (CYMBALTA) 60 MG capsule Take 1 capsule (60 mg total) by mouth daily. 90 capsule 0  . lisinopril (PRINIVIL,ZESTRIL) 20 MG tablet Take 1 tablet (20 mg total) by mouth daily. For blood pressure. 90 tablet 3  . Loperamide HCl (IMODIUM PO) Take 2 mg by mouth as needed.    . Multiple Vitamins-Minerals (ONE-A-DAY WOMENS 50+ ADVANTAGE PO) Take by mouth.    . ondansetron (ZOFRAN-ODT) 4 MG disintegrating tablet Take 4 mg by mouth as needed.   1  . OVER THE COUNTER MEDICATION     . valACYclovir (VALTREX) 500 MG tablet Take twice daily for 3-5 days as needed (Patient taking differently: Take 500 mg by mouth as needed. ) 30 tablet 12   No current facility-administered medications on file prior to visit.     BP (!) 158/98   Pulse 75   Temp 98.2 F (36.8 C) (Oral)   Ht 5\' 3"  (1.6 m)   Wt 148 lb 8 oz (67.4 kg)   SpO2 98%   BMI 26.31 kg/m    Objective:   Physical Exam  Constitutional: She appears well-nourished.  Neck: Neck supple.  Cardiovascular: Normal rate and regular rhythm.  Respiratory: Effort normal and breath sounds normal.  Skin: Skin is warm and dry.  Psychiatric: She has a normal mood and affect.           Assessment & Plan:

## 2018-08-27 NOTE — Patient Instructions (Signed)
Increase your lisinopril to 1 and 1/2 tablet daily for blood pressure.  Start monitoring your blood pressure 1-2 times daily, around the same time of day, for the next 1-2 weeks.  Ensure that you have rested for 30 minutes prior to checking your blood pressure. Record your readings and send me some updates via my chart.  It was a pleasure to see you today!

## 2018-08-27 NOTE — Addendum Note (Signed)
Addended by: Tawnya Crook on: 08/27/2018 01:06 PM   Modules accepted: Orders

## 2018-08-30 ENCOUNTER — Ambulatory Visit: Payer: Self-pay | Admitting: Primary Care

## 2018-08-31 ENCOUNTER — Ambulatory Visit (INDEPENDENT_AMBULATORY_CARE_PROVIDER_SITE_OTHER): Payer: BLUE CROSS/BLUE SHIELD | Admitting: Psychology

## 2018-08-31 DIAGNOSIS — F411 Generalized anxiety disorder: Secondary | ICD-10-CM

## 2018-09-13 ENCOUNTER — Other Ambulatory Visit: Payer: Self-pay

## 2018-09-13 ENCOUNTER — Ambulatory Visit (INDEPENDENT_AMBULATORY_CARE_PROVIDER_SITE_OTHER): Payer: BLUE CROSS/BLUE SHIELD | Admitting: Psychology

## 2018-09-13 DIAGNOSIS — F411 Generalized anxiety disorder: Secondary | ICD-10-CM

## 2018-09-16 DIAGNOSIS — H00022 Hordeolum internum right lower eyelid: Secondary | ICD-10-CM | POA: Diagnosis not present

## 2018-09-21 ENCOUNTER — Ambulatory Visit: Payer: BLUE CROSS/BLUE SHIELD | Admitting: Psychology

## 2018-09-28 ENCOUNTER — Ambulatory Visit: Payer: BLUE CROSS/BLUE SHIELD | Admitting: Psychology

## 2018-10-13 ENCOUNTER — Ambulatory Visit (INDEPENDENT_AMBULATORY_CARE_PROVIDER_SITE_OTHER): Payer: BLUE CROSS/BLUE SHIELD | Admitting: Psychology

## 2018-10-13 DIAGNOSIS — F411 Generalized anxiety disorder: Secondary | ICD-10-CM

## 2018-10-19 ENCOUNTER — Ambulatory Visit (INDEPENDENT_AMBULATORY_CARE_PROVIDER_SITE_OTHER): Payer: BLUE CROSS/BLUE SHIELD | Admitting: Psychology

## 2018-10-19 DIAGNOSIS — F411 Generalized anxiety disorder: Secondary | ICD-10-CM | POA: Diagnosis not present

## 2018-10-29 ENCOUNTER — Telehealth: Payer: Self-pay | Admitting: Primary Care

## 2018-10-29 NOTE — Telephone Encounter (Signed)
Please notify patient that I would like to follow up with her regarding anxiety and depression. We made a dose adjustment several months ago so I'd like to catch up. Please schedule.

## 2018-10-29 NOTE — Telephone Encounter (Signed)
Lvm asking pt to call office 

## 2018-11-01 ENCOUNTER — Ambulatory Visit: Payer: BLUE CROSS/BLUE SHIELD | Admitting: Psychology

## 2018-11-15 ENCOUNTER — Ambulatory Visit: Payer: BLUE CROSS/BLUE SHIELD | Admitting: Primary Care

## 2018-11-17 ENCOUNTER — Telehealth: Payer: Self-pay | Admitting: Primary Care

## 2018-11-17 ENCOUNTER — Ambulatory Visit: Payer: BLUE CROSS/BLUE SHIELD | Admitting: Primary Care

## 2018-11-17 NOTE — Telephone Encounter (Signed)
Called patient since she did not show for 11:00 appt. Need to see if she still needs to be seen. Can reschedule for another time.

## 2018-12-15 ENCOUNTER — Other Ambulatory Visit: Payer: Self-pay | Admitting: Primary Care

## 2018-12-15 DIAGNOSIS — F32A Depression, unspecified: Secondary | ICD-10-CM

## 2018-12-15 DIAGNOSIS — F419 Anxiety disorder, unspecified: Secondary | ICD-10-CM

## 2018-12-15 DIAGNOSIS — F329 Major depressive disorder, single episode, unspecified: Secondary | ICD-10-CM

## 2018-12-15 NOTE — Telephone Encounter (Signed)
See my chart message. Awaiting response.

## 2018-12-15 NOTE — Telephone Encounter (Signed)
Last prescribed on 07/26/2018. Last appointment on 08/27/2018. No show on 11/17/2018. No future appointment.  Message left for patient to return my call.

## 2018-12-16 ENCOUNTER — Encounter: Payer: Self-pay | Admitting: Primary Care

## 2018-12-16 ENCOUNTER — Other Ambulatory Visit: Payer: Self-pay

## 2018-12-16 ENCOUNTER — Other Ambulatory Visit (INDEPENDENT_AMBULATORY_CARE_PROVIDER_SITE_OTHER): Payer: BC Managed Care – PPO

## 2018-12-16 ENCOUNTER — Ambulatory Visit (INDEPENDENT_AMBULATORY_CARE_PROVIDER_SITE_OTHER): Payer: BC Managed Care – PPO | Admitting: Primary Care

## 2018-12-16 VITALS — Wt 148.0 lb

## 2018-12-16 DIAGNOSIS — I10 Essential (primary) hypertension: Secondary | ICD-10-CM

## 2018-12-16 DIAGNOSIS — R6889 Other general symptoms and signs: Secondary | ICD-10-CM

## 2018-12-16 DIAGNOSIS — M2550 Pain in unspecified joint: Secondary | ICD-10-CM | POA: Diagnosis not present

## 2018-12-16 DIAGNOSIS — F419 Anxiety disorder, unspecified: Secondary | ICD-10-CM

## 2018-12-16 DIAGNOSIS — F329 Major depressive disorder, single episode, unspecified: Secondary | ICD-10-CM

## 2018-12-16 DIAGNOSIS — E559 Vitamin D deficiency, unspecified: Secondary | ICD-10-CM

## 2018-12-16 DIAGNOSIS — R7989 Other specified abnormal findings of blood chemistry: Secondary | ICD-10-CM

## 2018-12-16 DIAGNOSIS — M19042 Primary osteoarthritis, left hand: Secondary | ICD-10-CM

## 2018-12-16 DIAGNOSIS — R400 Somnolence: Secondary | ICD-10-CM | POA: Diagnosis not present

## 2018-12-16 DIAGNOSIS — M19041 Primary osteoarthritis, right hand: Secondary | ICD-10-CM

## 2018-12-16 DIAGNOSIS — F32A Depression, unspecified: Secondary | ICD-10-CM

## 2018-12-16 HISTORY — DX: Other general symptoms and signs: R68.89

## 2018-12-16 LAB — CBC
HCT: 38.9 % (ref 36.0–46.0)
Hemoglobin: 13 g/dL (ref 12.0–15.0)
MCHC: 33.3 g/dL (ref 30.0–36.0)
MCV: 101.5 fl — ABNORMAL HIGH (ref 78.0–100.0)
Platelets: 297 10*3/uL (ref 150.0–400.0)
RBC: 3.84 Mil/uL — ABNORMAL LOW (ref 3.87–5.11)
RDW: 12.9 % (ref 11.5–15.5)
WBC: 4 10*3/uL (ref 4.0–10.5)

## 2018-12-16 LAB — COMPREHENSIVE METABOLIC PANEL
ALT: 23 U/L (ref 0–35)
AST: 19 U/L (ref 0–37)
Albumin: 4.3 g/dL (ref 3.5–5.2)
Alkaline Phosphatase: 105 U/L (ref 39–117)
BUN: 15 mg/dL (ref 6–23)
CO2: 31 mEq/L (ref 19–32)
Calcium: 9.3 mg/dL (ref 8.4–10.5)
Chloride: 103 mEq/L (ref 96–112)
Creatinine, Ser: 0.59 mg/dL (ref 0.40–1.20)
GFR: 103.41 mL/min (ref >60.00)
Glucose, Bld: 96 mg/dL (ref 70–99)
Potassium: 4.5 mEq/L (ref 3.5–5.1)
Sodium: 139 mEq/L (ref 135–145)
Total Bilirubin: 0.5 mg/dL (ref 0.2–1.2)
Total Protein: 6.7 g/dL (ref 6.0–8.3)

## 2018-12-16 LAB — LIPID PANEL
Cholesterol: 221 mg/dL — ABNORMAL HIGH (ref 0–200)
HDL: 89.3 mg/dL (ref >39.00)
LDL Cholesterol: 111 mg/dL — ABNORMAL HIGH (ref 0–99)
NonHDL: 131.32
Total CHOL/HDL Ratio: 2
Triglycerides: 101 mg/dL (ref 0.0–149.0)
VLDL: 20.2 mg/dL (ref 0.0–40.0)

## 2018-12-16 LAB — HEMOGLOBIN A1C: Hgb A1c MFr Bld: 5.4 % (ref 4.6–6.5)

## 2018-12-16 LAB — VITAMIN B12: Vitamin B-12: 190 pg/mL — ABNORMAL LOW (ref 211–911)

## 2018-12-16 LAB — TSH: TSH: 1.55 u[IU]/mL (ref 0.35–4.50)

## 2018-12-16 LAB — SEDIMENTATION RATE: Sed Rate: 9 mm/hr (ref 0–30)

## 2018-12-16 LAB — VITAMIN D 25 HYDROXY (VIT D DEFICIENCY, FRACTURES): VITD: 44.15 ng/mL (ref 30.00–100.00)

## 2018-12-16 NOTE — Telephone Encounter (Signed)
Met with patient during visit today, she is doing better on increased dose of duloxetine 60 mg, continue same.

## 2018-12-16 NOTE — Assessment & Plan Note (Signed)
Repeat vitamin D pending. 

## 2018-12-16 NOTE — Assessment & Plan Note (Signed)
Patient "nodding" of intermittently during the day, also snoring. Will send to neurology for evaluation and also for sleep study.

## 2018-12-16 NOTE — Assessment & Plan Note (Signed)
Present over the last several months since increase in personal stress. Today she seemed well, spoke coherently, and provided clear information for HPI.  Suspect symptoms could be from anxiety/stress but given family history of brain tumor in mother and also other symptoms, will send to neurology for evaluation. Labs also pending.

## 2018-12-16 NOTE — Assessment & Plan Note (Signed)
Repeat TSH pending

## 2018-12-16 NOTE — Assessment & Plan Note (Signed)
Improved on increased dose of duloxetine to 60 mg, does still have anxiety.   Suspect some of her symptoms could be secondary to stress/anxiety, but given family history and also other symptoms we will send her to neurology for evaluation. Labs pending.

## 2018-12-16 NOTE — Assessment & Plan Note (Signed)
Patient endorses normal home readings of 120's/70's and is back down to her lisinopril 20 mg.

## 2018-12-16 NOTE — Patient Instructions (Signed)
Call the main line to schedule a lab appointment as discussed.  You will be contacted regarding your referral to neurology.  Please let us know if you have not been contacted within one week.   Limit alcohol use as discussed.   It was a pleasure to see you today! Allie Bossier, NP-C

## 2018-12-16 NOTE — Progress Notes (Signed)
Subjective:    Patient ID: Christina [name], female    DOB: 12/20/1955, 63 y.o.   MRN: [id number]  HPI  Virtual Visit via Video Note  I connected with Christina [name] on 12/16/18 at 10:40 AM EDT by a video enabled telemedicine application and verified that I am speaking with the correct person using two identifiers.  Location: Patient: Home Provider: Office   I discussed the limitations of evaluation and management by telemedicine and the availability of in person appointments. The patient expressed understanding and agreed to proceed.  History of Present Illness:  Christina [name] is a 63 year old female who presents today for follow up.  1) Essential Hypertension: Currently managed on lisinopril 20 mg. She was last evaluated in person on 08/27/18 with elevated BP readings and reported of headaches so her lisinopril was increased to 30 mg. She has since reduced her lisinopril back down to 20 mg as her BP is running 120's/70's. Headaches have improved.   She has noticed swelling to the hands and feet that is intermittent.   BP Readings from Last 3 Encounters:  08/27/18 (!) 158/98  07/26/18 122/74  07/01/18 132/88   2) Anxiety and Depression: Currently managed on duloxetine 60 mg which was increased from 30 mg in January 2020. Since the dose increase she feels as though she's able to manage stress and anxiety better. She continues to have intermittent bouts of anxiety but this has reduced overall. She is still under a lot of personal stress.  3) Strange Behavior: Her husband is with her today who endorses several strange behaviors from patient. Symptoms include nodding off intermittently typically around dinner time (4 pm). She will be eating, have a fork in her hand, then her eyes will glaze over, head will tilt down for about 30-40 seconds, then she'll lift back up. Se doesn't ever realize that this is occurring. She has also acted strange during conversations, will say something completely off  topic and unrelated during the discussion. Her husband also notes that she'll become hyperactive in the later evening, examples include will clean the house around 10 pm and can't stop until she finishes. He also notes that she is very forgetful, for example will come home from the grocery store without the items she needed; she'll mention wanting to do something the following day and when the following day comes she'll forget when she planned on doing.   She denies daytime drowsiness, but her husband endorses snoring. She She was at the beach for one week recently with just she and her husband and these symptoms didn't occur. She does have a lot of personal stress and has increased her alcohol intake during the week. She is drinking a mimosa nearly every morning, 3-4 beers daily. Her husband believes she will drink a 12 pack daily. She denies dizziness, blurry vision, unilateral weakness. Her mother has a history of brain tumor that ended up being malignant. She passed away from brain cancer last year.    Observations/Objective:  Alert and oriented. Appears well, not sickly. No distress. Speaking in complete sentences.   Assessment and Plan:  See problem based charting.   Follow Up Instructions:  Call the main line to schedule a lab appointment as discussed.  You will be contacted regarding your referral to neurology.  Please let us know if you have not been contacted within one week.   Limit alcohol use as discussed.   It was a pleasure to see you today! Christina DireKate  Carlis Abbott, NP-C    I discussed the assessment and treatment plan with the patient. The patient was provided an opportunity to ask questions and all were answered. The patient agreed with the plan and demonstrated an understanding of the instructions.   The patient was advised to call back or seek an in-person evaluation if the symptoms worsen or if the condition fails to improve as anticipated.     Pleas Koch, NP     Review of Systems  Eyes: Negative for visual disturbance.  Respiratory: Negative for shortness of breath.        Daytime nodding   Cardiovascular: Negative for chest pain.  Neurological: Negative for dizziness, speech difficulty, weakness and headaches.  Psychiatric/Behavioral: The patient is nervous/anxious.        See HPI.       Past Medical History:  Diagnosis Date  . Depression   . GAD (generalized anxiety disorder)   . Genital herpes   . Osteoarthritis   . Seasonal allergies      Social History   Socioeconomic History  . Marital status: Married    Spouse name: Not on file  . Number of children: Not on file  . Years of education: Not on file  . Highest education level: Not on file  Occupational History  . Not on file  Social Needs  . Financial resource strain: Not on file  . Food insecurity    Worry: Not on file    Inability: Not on file  . Transportation needs    Medical: Not on file    Non-medical: Not on file  Tobacco Use  . Smoking status: Never Smoker  . Smokeless tobacco: Never Used  Substance and Sexual Activity  . Alcohol use: Yes    Alcohol/week: 0.0 standard drinks    Comment: daily   . Drug use: No  . Sexual activity: Yes    Birth control/protection: Surgical    Comment: FEB. 1980, 4 PARTNERS.  Lifestyle  . Physical activity    Days per week: Not on file    Minutes per session: Not on file  . Stress: Not on file  Relationships  . Social Herbalist on phone: Not on file    Gets together: Not on file    Attends religious service: Not on file    Active member of club or organization: Not on file    Attends meetings of clubs or organizations: Not on file    Relationship status: Not on file  . Intimate partner violence    Fear of current or ex partner: Not on file    Emotionally abused: Not on file    Physically abused: Not on file    Forced sexual activity: Not on file  Other Topics Concern  . Not on file  Social History  Narrative   Married.   2 children.   Works as Materials engineer.     Past Surgical History:  Procedure Laterality Date  . CARPAL TUNNEL RELEASE Bilateral 05/21/2013   BILATERAL CARPAL TUNNEL SURGERY  . CESAREAN SECTION  1990  . CESAREAN SECTION  1992  . VAGINAL HYSTERECTOMY      Family History  Problem Relation Age of Onset  . Cancer Mother        brain  . Ovarian cancer Mother   . Cancer Father        prostate  . Breast cancer Maternal Grandmother   . Ovarian cancer Maternal Grandmother   .  Heart Problems Brother   . Healthy Daughter   . Healthy Daughter   . Healthy Son   . Healthy Son     Allergies  Allergen Reactions  . Toradol [Ketorolac Tromethamine]     Nausea, vomiting  . Ultram [Tramadol]     Nausea     Current Outpatient Medications on File Prior to Visit  Medication Sig Dispense Refill  . acyclovir ointment (ZOVIRAX) 5 % Apply 1 application topically every 3 (three) hours. 30 g 2  . dicyclomine (BENTYL) 10 MG capsule Take 1 capsule (10 mg total) by mouth 4 (four) times daily -  before meals and at bedtime. As needed. 30 capsule 0  . DULoxetine (CYMBALTA) 60 MG capsule Take 1 capsule (60 mg total) by mouth daily. 90 capsule 0  . lisinopril (PRINIVIL,ZESTRIL) 20 MG tablet Take 1 tablet (20 mg total) by mouth daily. For blood pressure. 90 tablet 3  . Loperamide HCl (IMODIUM PO) Take 2 mg by mouth as needed.    . Multiple Vitamins-Minerals (ONE-A-DAY WOMENS 50+ ADVANTAGE PO) Take by mouth.    . ondansetron (ZOFRAN-ODT) 4 MG disintegrating tablet Take 4 mg by mouth as needed.   1  . OVER THE COUNTER MEDICATION     . valACYclovir (VALTREX) 500 MG tablet Take twice daily for 3-5 days as needed (Patient taking differently: Take 500 mg by mouth as needed. ) 30 tablet 12   No current facility-administered medications on file prior to visit.     Wt 148 lb (67.1 kg)   BMI 26.22 kg/m    Objective:   Physical Exam  Constitutional: She is oriented to person, place,  and time. She appears well-nourished.  Respiratory: Effort normal.  Neurological: She is alert and oriented to person, place, and time.  Psychiatric: She has a normal mood and affect.           Assessment & Plan:

## 2018-12-16 NOTE — Assessment & Plan Note (Signed)
Chronic, now with acute swelling. Labs pending.

## 2018-12-17 LAB — RHEUMATOID FACTOR: Rheumatoid fact SerPl-aCnc: <14 IU/mL (ref <14)

## 2018-12-17 LAB — CYCLIC CITRUL PEPTIDE ANTIBODY, IGG: Cyclic Citrullin Peptide Ab: <16 UNITS

## 2018-12-28 ENCOUNTER — Ambulatory Visit (INDEPENDENT_AMBULATORY_CARE_PROVIDER_SITE_OTHER): Payer: BC Managed Care – PPO

## 2018-12-28 DIAGNOSIS — E538 Deficiency of other specified B group vitamins: Secondary | ICD-10-CM | POA: Diagnosis not present

## 2018-12-28 MED ORDER — CYANOCOBALAMIN 1000 MCG/ML IJ SOLN
1000.0000 ug | Freq: Once | INTRAMUSCULAR | Status: AC
  Administered 2018-12-28: 1000 ug via INTRAMUSCULAR

## 2018-12-28 NOTE — Progress Notes (Addendum)
Pt received 1st of 3 monthly B12 injections in Left Deltoid. Tolerated well. She will call to schedule next months injection.

## 2018-12-29 ENCOUNTER — Ambulatory Visit: Payer: BC Managed Care – PPO | Admitting: Neurology

## 2018-12-29 ENCOUNTER — Other Ambulatory Visit: Payer: Self-pay

## 2018-12-29 ENCOUNTER — Encounter: Payer: Self-pay | Admitting: Neurology

## 2018-12-29 VITALS — BP 155/88 | HR 73 | Ht 64.0 in | Wt 151.0 lb

## 2018-12-29 DIAGNOSIS — R0683 Snoring: Secondary | ICD-10-CM

## 2018-12-29 DIAGNOSIS — R51 Headache: Secondary | ICD-10-CM

## 2018-12-29 DIAGNOSIS — R419 Unspecified symptoms and signs involving cognitive functions and awareness: Secondary | ICD-10-CM | POA: Diagnosis not present

## 2018-12-29 DIAGNOSIS — Z8489 Family history of other specified conditions: Secondary | ICD-10-CM | POA: Diagnosis not present

## 2018-12-29 DIAGNOSIS — E538 Deficiency of other specified B group vitamins: Secondary | ICD-10-CM

## 2018-12-29 DIAGNOSIS — R519 Headache, unspecified: Secondary | ICD-10-CM

## 2018-12-29 DIAGNOSIS — R413 Other amnesia: Secondary | ICD-10-CM

## 2018-12-29 DIAGNOSIS — G4719 Other hypersomnia: Secondary | ICD-10-CM

## 2018-12-29 DIAGNOSIS — R6889 Other general symptoms and signs: Secondary | ICD-10-CM

## 2018-12-29 DIAGNOSIS — R351 Nocturia: Secondary | ICD-10-CM

## 2018-12-29 NOTE — Progress Notes (Signed)
Subjective:    Patient ID: Christina [name] is a 63 y.o. female.  HPI     Star Age, MD, PhD [facility] [address]O. [place], [place] [address]  Dear Christina [name],   I saw your patient, Christina [name], upon your kind request, in my sleep clinic today for initial consultation of her sleep disorder, in particular, concern for underlying obstructive sleep apnea.  The patient is accompanied by her husband today.  As you know, Christina [name] is a 63 year old right-handed woman with an underlying medical history of seasonal allergies, anxiety, depression, osteoarthritis, recent diagnosis of B12 deficiency and mildly overweight state, who reports snoring and excessive daytime somnolence as well as forgetfulness.  Her husband reports that she has spells of inattentiveness, she seems to doze off and also lose her train of thought.  She may talk about 1 topic and immediately start talking about another topic.  I reviewed your telemedicine visit note from 12/16/2018.  She recently started B12 injections.  Her Epworth sleepiness score is 15 out of 24 today, fatigue severity score is 55 out of 63.  She reports a diagnosis of fibromyalgia, she also has a history of back pain and was in pain management.  She reports that she was on narcotic pain medication until March 2019.  She has been on Cymbalta, 60 mg strength, she started this several months ago.  For the past 6 or 7 months she has been more forgetful and more sleepy per husband's report.  She does snore and has woken herself up with her own snoring.  She reports that she does not sleep very well.  She goes to bed late, not unusual to be in bed after midnight.  Her husband comes home from work late, he gets off work at [employer].  She is a non-smoker and does drink alcohol regularly, she was recently advised by you to cut back as I understand.  Her husband indicates that she drinks daily or nearly daily, she denies drinking any alcohol  daily at this time.  She works, owns a business.  She lives with her husband, she has 4 biological children and her older son age 47 is currently staying with them.  She has 2 stepchildren.  She reports significant stress.  She tries to hydrate well with water, she has recently started B12 injections.  She does not drink caffeine daily.  She reports that her mother was diagnosed with a brain tumor, she believes it was glioblastoma.  Her rise time is currently between 7:30 and 9.  She has to go to the bathroom at night 2 or 3 times on average and has woken up with a headache, reports having fairly frequent headaches in the mornings. She had a tonsillectomy at age 63.  Her Past Medical History Is Significant For: Past Medical History:  Diagnosis Date  . Depression   . GAD (generalized anxiety disorder)   . Genital herpes   . Osteoarthritis   . Seasonal allergies     Her Past Surgical History Is Significant For: Past Surgical History:  Procedure Laterality Date  . CARPAL TUNNEL RELEASE Bilateral 05/21/2013   BILATERAL CARPAL TUNNEL SURGERY  . CESAREAN SECTION  1990  . CESAREAN SECTION  1992  . VAGINAL HYSTERECTOMY      Her Family History Is Significant For: Family History  Problem Relation Age of Onset  . Cancer Mother        brain  . Ovarian cancer  Mother   . Cancer Father        prostate  . Breast cancer Maternal Grandmother   . Ovarian cancer Maternal Grandmother   . Heart Problems Brother   . Healthy Daughter   . Healthy Daughter   . Healthy Son   . Healthy Son     Her Social History Is Significant For: Social History   Socioeconomic History  . Marital status: Married    Spouse name: Not on file  . Number of children: Not on file  . Years of education: Not on file  . Highest education level: Not on file  Occupational History  . Not on file  Social Needs  . Financial resource strain: Not on file  . Food insecurity    Worry: Not on file    Inability: Not on file   . Transportation needs    Medical: Not on file    Non-medical: Not on file  Tobacco Use  . Smoking status: Never Smoker  . Smokeless tobacco: Never Used  Substance and Sexual Activity  . Alcohol use: Yes    Alcohol/week: 0.0 standard drinks    Comment: daily   . Drug use: No  . Sexual activity: Yes    Birth control/protection: Surgical    Comment: FEB. 1980, 4 PARTNERS.  Lifestyle  . Physical activity    Days per week: Not on file    Minutes per session: Not on file  . Stress: Not on file  Relationships  . Social Musicianconnections    Talks on phone: Not on file    Gets together: Not on file    Attends religious service: Not on file    Active member of club or organization: Not on file    Attends meetings of clubs or organizations: Not on file    Relationship status: Not on file  Other Topics Concern  . Not on file  Social History Narrative   Married.   2 children.   Works as Arts development officerhome maker.     Her Allergies Are:  Allergies  Allergen Reactions  . Toradol [Ketorolac Tromethamine]     Nausea, vomiting  . Ultram [Tramadol]     Nausea   :   Her Current Medications Are:  Outpatient Encounter Medications as of 12/29/2018  Medication Sig  . acyclovir ointment (ZOVIRAX) 5 % Apply 1 application topically every 3 (three) hours.  Marland Kitchen. dicyclomine (BENTYL) 10 MG capsule Take 1 capsule (10 mg total) by mouth 4 (four) times daily -  before meals and at bedtime. As needed.  . DULoxetine (CYMBALTA) 60 MG capsule Take 1 capsule (60 mg total) by mouth daily. For anxiety and depression.  Marland Kitchen. lisinopril (PRINIVIL,ZESTRIL) 20 MG tablet Take 1 tablet (20 mg total) by mouth daily. For blood pressure.  . Loperamide HCl (IMODIUM PO) Take 2 mg by mouth as needed.  . Multiple Vitamins-Minerals (ONE-A-DAY WOMENS 50+ ADVANTAGE PO) Take by mouth.  . ondansetron (ZOFRAN-ODT) 4 MG disintegrating tablet Take 4 mg by mouth as needed.   Marland Kitchen. OVER THE COUNTER MEDICATION   . valACYclovir (VALTREX) 500 MG tablet Take  twice daily for 3-5 days as needed (Patient taking differently: Take 500 mg by mouth as needed. )   No facility-administered encounter medications on file as of 12/29/2018.   :  Review of Systems:  Out of a complete 14 point review of systems, all are reviewed and negative with the exception of these symptoms as listed below:  Review of Systems  Neurological:  Pt presents today to discuss her sleep. Pt has never had a sleep study but does endorse snoring.  Epworth Sleepiness Scale 0= would never doze 1= slight chance of dozing 2= moderate chance of dozing 3= high chance of dozing  Sitting and reading: 3 Watching TV: 3 Sitting inactive in a public place (ex. Theater or meeting): 1 As a passenger in a car for an hour without a break: 3 Lying down to rest in the afternoon: 3 Sitting and talking to someone: 0 Sitting quietly after lunch (no alcohol): 2 In a car, while stopped in traffic: 0 Total: 15     Objective:  Neurological Exam  Physical Exam Physical Examination:   Vitals:   12/29/18 1559  BP: (!) 155/88  Pulse: 73    General Examination: The patient is a very pleasant 63 y.o. female in no acute distress. She appears well-developed and well-nourished and well groomed.   HEENT: Normocephalic, atraumatic, pupils are equal, round and reactive to light and accommodation. Extraocular tracking is good without limitation to gaze excursion or nystagmus noted. Normal smooth pursuit is noted. Hearing is grossly intact. Face is symmetric with normal facial animation and normal facial sensation. Speech is clear with no dysarthria noted. There is no hypophonia. There is no lip, neck/head, jaw or voice tremor. Neck is supple with full range of passive and active motion. There are no carotid bruits on auscultation. Oropharynx exam reveals: mild mouth dryness, adequate dental hygiene and mild airway crowding, due to smaller airway entry. Mallampati is class I. Tongue protrudes  centrally and palate elevates symmetrically. Tonsils are absent. Neck size is 12.75 inches.  Chest: Clear to auscultation without wheezing, rhonchi or crackles noted.  Heart: S1+S2+0, regular and normal without murmurs, rubs or gallops noted.   Abdomen: Soft, non-tender and non-distended with normal bowel sounds appreciated on auscultation.  Extremities: There is no pitting edema in the distal lower extremities bilaterally. Pedal pulses are intact.  Skin: Warm and dry without trophic changes noted. There are no varicose veins.  Musculoskeletal: exam reveals no obvious joint deformities, tenderness or joint swelling or erythema.   Neurologically:  Mental status: The patient is awake, alert and oriented in all 4 spheres. Her immediate and remote memory, attention, language skills and fund of knowledge are appropriate. There is no evidence of aphasia, agnosia, apraxia or anomia. Speech is clear with normal prosody and enunciation. Thought process is linear. Mood is constricted and affect is blunted.  Cranial nerves II - XII are as described above under HEENT exam. In addition: shoulder shrug is normal with equal shoulder height noted. Motor exam: Normal bulk, strength and tone is noted. There is no drift, tremor or rebound. Romberg is negative. Reflexes are 1+. Fine motor skills and coordination: grossly intact.  Cerebellar testing: No dysmetria or intention tremor.  Sensory exam: intact to light touch.  Gait, station and balance: She stands easily. No veering to one side is noted. No leaning to one side is noted. Posture is age-appropriate and stance is narrow based. Gait shows normal stride length and normal pace. No problems turning are noted. Slight Insecurity with turns noted.               Assessment and Plan:  In summary, Christina [name] is a very pleasant 63 y.o.-year old female with an underlying medical history of seasonal allergies, anxiety, depression, osteoarthritis, recent diagnosis of  B12 deficiency and mildly overweight state, whose history and physical exam concerning for obstructive sleep apnea (  OSA). I had a long chat with the patient and her about my findings and the diagnosis of OSA, its prognosis and treatment options. We talked about medical treatments, surgical interventions and non-pharmacological approaches. I explained in particular the risks and ramifications of untreated moderate to severe OSA, especially with respect to developing cardiovascular disease down the Road, including congestive heart failure, difficult to treat hypertension, cardiac arrhythmias, or stroke. Even type 2 diabetes has, in part, been linked to untreated OSA. Symptoms of untreated OSA include daytime sleepiness, memory problems, mood irritability and mood disorder such as depression and anxiety, lack of energy, as well as recurrent headaches, especially morning headaches. We talked about trying to maintain a healthy lifestyle in general, as well as the importance of weight control. I encouraged the patient to eat healthy, exercise daily and keep well hydrated, to keep a scheduled bedtime and wake time routine, to not skip any meals and eat healthy snacks in between meals. I advised the patient not to drive when feeling sleepy.  I recommended the following at this time: sleep study. She reports forgetfulness and her husband has noted that she loses her train of thought.  She seems in a daze sometimes and sometimes frankly dozes off.  Given her history of morning headaches, her family history of brain tumor and her forgetfulness we will also proceed with a brain MRI with and without contrast. She is advised that B12 deficiency can affect cognitive function and she should continue with the supplementation.  I explained the sleep test procedure to the patient and also outlined possible surgical and non-surgical treatment options of OSA, including the use of a custom-made dental device (which would require a  referral to a specialist dentist or oral surgeon), upper airway surgical options, such as pillar implants, radiofrequency surgery, tongue base surgery, and UPPP (which would involve a referral to an ENT surgeon). Rarely, jaw surgery such as mandibular advancement may be considered.  I also explained the CPAP treatment option to the patient, who indicated that she would be willing to try CPAP if the need arises. I explained the importance of being compliant with PAP treatment, not only for insurance purposes but primarily to improve Her symptoms, and for the patient's long term health benefit, including to reduce Her cardiovascular risks. I answered all their questions today and the patient and her husband were in agreement. I plan to see her back after these tests.   Thank you very much for allowing me to participate in the care of this nice patient. If I can be of any further assistance to you please do not hesitate to call me at [phone].  Sincerely,   Huston FoleySaima Joanny Dupree, MD, PhD

## 2018-12-29 NOTE — Patient Instructions (Addendum)
Thank you for choosing [facility] for your sleep related care! It was nice to meet you today! I appreciate that you entrust me with your sleep related healthcare concerns. I hope, I was able to address at least some of your concerns today, and that I can help you feel reassured and also get better.    Here is what we discussed today and what we came up with as our plan for you:   For your Memory complaints, and in light of your mother's history of brain tumor, I will order a brain MRI with and without contrast, we will call you with the results, this requires insurance authorization.    Based on your symptoms and your exam I believe you are at risk for obstructive sleep apnea (aka OSA), and I think we should proceed with a sleep study to determine whether you do or do not have OSA and how severe it is. Even, if you have mild OSA, I may want you to consider treatment with CPAP, as treatment of even borderline or mild sleep apnea can result and improvement of symptoms such as sleep disruption, daytime sleepiness, nighttime bathroom breaks, restless leg symptoms, improvement of headache syndromes, even improved mood disorder.   Please remember, the long-term risks and ramifications of untreated moderate to severe obstructive sleep apnea are: increased Cardiovascular disease, including congestive heart failure, stroke, difficult to control hypertension, treatment resistant obesity, arrhythmias, especially irregular heartbeat commonly known as A. Fib. (atrial fibrillation); even type 2 diabetes has been linked to untreated OSA.   Sleep apnea can cause disruption of sleep and sleep deprivation in most cases, which, in turn, can cause recurrent headaches, problems with memory, mood, concentration, focus, and vigilance. Most people with untreated sleep apnea report excessive daytime sleepiness, which can affect their ability to drive. Please do not drive if you feel sleepy. Patients with sleep  apnea developed difficulty initiating and maintaining sleep (aka insomnia).   Having sleep apnea may increase your risk for other sleep disorders, including involuntary behaviors sleep such as sleep terrors, sleep talking, sleepwalking.    Having sleep apnea can also increase your risk for restless leg syndrome and leg movements at night.   Please note that untreated obstructive sleep apnea may carry additional perioperative morbidity. Patients with significant obstructive sleep apnea (typically, in the moderate to severe degree) should receive, if possible, perioperative PAP (positive airway pressure) therapy and the surgeons and particularly the anesthesiologists should be informed of the diagnosis and the severity of the sleep disordered breathing.   I will likely see you back after your sleep study to go over the test results and where to go from there. We will call you after your sleep study to advise about the results (most likely, you will hear from [place], my nurse) and to set up an appointment at the time, as necessary.    Our sleep lab administrative assistant will call you to schedule your sleep study and give you further instructions, regarding the check in process for the sleep study, arrival time, what to bring, when you can expect to leave after the study, etc., and to answer any other logistical questions you may have. If you don't hear back from her by about 2 weeks from now, please feel free to call her direct line at [phone] or you can call our general clinic number, or email [place] through My Chart.

## 2019-01-03 ENCOUNTER — Telehealth: Payer: Self-pay | Admitting: Neurology

## 2019-01-03 MED ORDER — ALPRAZOLAM 0.5 MG PO TABS: ORAL_TABLET | ORAL | 0 refills | Status: DC

## 2019-01-03 NOTE — Telephone Encounter (Signed)
no to the covid-19 questions MR Brain w/wo contrast Dr. Reece Leader Auth: [id number] (exp. 12/30/18 to 06/27/19). Patient is scheduled at [facility] for 01/05/19. She did inform me she is claustrophobic and will need something to help her. She is aware to have a driver.

## 2019-01-03 NOTE — Telephone Encounter (Signed)
I called pt, discussed xanax on call for MRI and how to take it. Pt will have a driver to and from the MRI. Pt verbalized understanding of recommendations.

## 2019-01-03 NOTE — Addendum Note (Signed)
Addended by: Star Age on: 01/03/2019 01:03 PM   Modules accepted: Orders

## 2019-01-03 NOTE — Telephone Encounter (Signed)
I have ordered Xanax for patient's upcoming MRI due to anxiety/claustrophobia reported. Please inform patient or caregiver and remind them, that she should not drive after taking Xanax and have someone take her for the MRI appointment.   

## 2019-01-05 ENCOUNTER — Other Ambulatory Visit: Payer: BC Managed Care – PPO

## 2019-01-15 ENCOUNTER — Other Ambulatory Visit: Payer: Self-pay | Admitting: Women's Health

## 2019-01-15 DIAGNOSIS — B009 Herpesviral infection, unspecified: Secondary | ICD-10-CM

## 2019-01-17 NOTE — Telephone Encounter (Signed)
Patient r/s for 01/18/19.

## 2019-01-18 ENCOUNTER — Ambulatory Visit: Payer: BC Managed Care – PPO

## 2019-01-18 ENCOUNTER — Other Ambulatory Visit: Payer: Self-pay

## 2019-01-18 DIAGNOSIS — R413 Other amnesia: Secondary | ICD-10-CM

## 2019-01-18 DIAGNOSIS — E538 Deficiency of other specified B group vitamins: Secondary | ICD-10-CM

## 2019-01-18 DIAGNOSIS — G4719 Other hypersomnia: Secondary | ICD-10-CM

## 2019-01-18 DIAGNOSIS — R419 Unspecified symptoms and signs involving cognitive functions and awareness: Secondary | ICD-10-CM | POA: Diagnosis not present

## 2019-01-18 DIAGNOSIS — R6889 Other general symptoms and signs: Secondary | ICD-10-CM

## 2019-01-18 DIAGNOSIS — Z8489 Family history of other specified conditions: Secondary | ICD-10-CM

## 2019-01-18 DIAGNOSIS — R351 Nocturia: Secondary | ICD-10-CM

## 2019-01-18 DIAGNOSIS — R51 Headache: Secondary | ICD-10-CM

## 2019-01-18 DIAGNOSIS — R0683 Snoring: Secondary | ICD-10-CM

## 2019-01-18 DIAGNOSIS — R519 Headache, unspecified: Secondary | ICD-10-CM

## 2019-01-18 MED ORDER — GADOBENATE DIMEGLUMINE 529 MG/ML IV SOLN
14.0000 mL | Freq: Once | INTRAVENOUS | Status: AC | PRN
  Administered 2019-01-18: 14 mL via INTRAVENOUS

## 2019-01-19 ENCOUNTER — Telehealth: Payer: Self-pay

## 2019-01-19 NOTE — Progress Notes (Signed)
Please call and advise the patient that the recent scan we did was within normal limits. We did a brain MRI w and wo contrast, which showed normal findings. In particular, there were no acute findings, such as a stroke, or mass or blood products. No further action is required on this test at this time. Please remind patient to keep any upcoming appointments or tests and to call us with any interim questions, concerns, problems or updates.  Scheduled for HST next week. Thanks,  Star Age, MD, PhD

## 2019-01-19 NOTE — Telephone Encounter (Signed)
I called pt, discussed her MRI results and recommendations. Pt verbalized understanding of results. Pt had no questions at this time but was encouraged to call back if questions arise.  

## 2019-01-19 NOTE — Telephone Encounter (Signed)
-----   Message from Star Age, MD sent at 01/19/2019  7:56 AM EDT ----- Please call and advise the patient that the recent scan we did was within normal limits. We did a brain MRI w and wo contrast, which showed normal findings. In particular, there were no acute findings, such as a stroke, or mass or blood products. No further action is required on this test at this time. Please remind patient to keep any upcoming appointments or tests and to call us with any interim questions, concerns, problems or updates.  Scheduled for HST next week. Thanks,  Star Age, MD, PhD

## 2019-01-24 ENCOUNTER — Ambulatory Visit (INDEPENDENT_AMBULATORY_CARE_PROVIDER_SITE_OTHER): Payer: BC Managed Care – PPO | Admitting: Neurology

## 2019-01-24 DIAGNOSIS — G4733 Obstructive sleep apnea (adult) (pediatric): Secondary | ICD-10-CM | POA: Diagnosis not present

## 2019-01-24 DIAGNOSIS — E538 Deficiency of other specified B group vitamins: Secondary | ICD-10-CM

## 2019-01-24 DIAGNOSIS — Z8489 Family history of other specified conditions: Secondary | ICD-10-CM

## 2019-01-24 DIAGNOSIS — R6889 Other general symptoms and signs: Secondary | ICD-10-CM

## 2019-01-24 DIAGNOSIS — R413 Other amnesia: Secondary | ICD-10-CM

## 2019-01-24 DIAGNOSIS — G4719 Other hypersomnia: Secondary | ICD-10-CM

## 2019-01-24 DIAGNOSIS — R419 Unspecified symptoms and signs involving cognitive functions and awareness: Secondary | ICD-10-CM

## 2019-01-24 DIAGNOSIS — R0683 Snoring: Secondary | ICD-10-CM

## 2019-01-24 DIAGNOSIS — R351 Nocturia: Secondary | ICD-10-CM

## 2019-01-27 ENCOUNTER — Telehealth: Payer: Self-pay

## 2019-01-27 NOTE — Telephone Encounter (Signed)
I called pt to discuss her sleep study results. No answer, left a message asking her to call me back. 

## 2019-01-27 NOTE — Telephone Encounter (Signed)
Pt returned my call. I discussed her sleep study results and recommendations with her. She would prefer to speak with her dentist regarding the oral appliance. She will call us if she needs a referral to another dentist. I recommended that she avoid supine sleep. She reports that she was told by her OBGYN to not lose any more weight. Pt verbalized understanding of results and recommendations. Pt had no questions at this time but was encouraged to call back if questions arise.

## 2019-01-27 NOTE — Progress Notes (Signed)
Patient referred by PCP NP, seen by me on 12/29/18, HST on 01/24/19.    Please call and notify the patient that the recent home sleep test showed obstructive sleep apnea. OSA is overall mild, but may be worth treating to see if she feels better after treatment. Overall AHI was 10.8/hour, O2 nadir 92%. Options for treatment are autoPAP machine at home, through a DME company (of her choice, or as per insurance requirement). The DME representative will educate her on how to use the machine, how to put the mask on, etc. She can work with a dentist to be fitted with an oral appl. If need be, we can make a referral to a specialized dentist.  Weight loss and avoiding the supine sleep position may also be an option.  autoPAP is likely to be more successful the quickest. I can put an order in, if she would like treatment. It may help her sleepiness and cognitive complaints.  Please let me know, how she would like to proceed.  Star Age, MD, PhD [facility])

## 2019-01-27 NOTE — Procedures (Signed)
Patient Information     First Name: Christina Last Name: [name] ID: [id number]  Birth Date: [birth year] Age: 63 Gender: Female  Referring Provider: Pleas Koch, NP BMI: 26.0 (W=152 lb, H=5' 4'')  Neck Circ.:  12.75 '' Epworth:  15/24   Sleep Study Information    Study Date: Jan 24, 2019 S/H/A Version: 001.001.001.001 / 4.1.1528 / 28  History:      63 year old woman with a history of seasonal allergies, anxiety, depression, osteoarthritis, vitamin B12 deficiency and mildly overweight state, who reports snoring and excessive daytime somnolence as well as forgetfulness.  Summary & Diagnosis:     OSA   Recommendations:       This home sleep test demonstrates overall mild obstructive sleep apnea with a total AHI of 10.8/hour and O2 nadir of 92%. Given the patient's medical history and sleep related complaints, treatment with positive airway pressure can be considered. This can be achieved in the form of autoPAP trial/titration at home. Other treatment options - generally speaking - include weight loss along with avoidance of the supine sleep position, an oral appliance through a qualified dentist, airway surgical procedures (through ENT).  Please note that untreated obstructive sleep apnea may carry additional perioperative morbidity. Patients with significant obstructive sleep apnea should receive perioperative PAP therapy and the surgeons and particularly the anesthesiologist should be informed of the diagnosis and the severity of the sleep disordered breathing. The patient should be cautioned not to drive, work at heights, or operate dangerous or heavy equipment when tired or sleepy. Review and reiteration of good sleep hygiene measures should be pursued with any patient. Other causes of the patient's symptoms, including circadian rhythm disturbances, an underlying mood disorder, medication effect and/or an underlying medical problem cannot be ruled out based on this test. Clinical correlation is  recommended. The patient and her referring provider will be notified of the test results. The patient will be seen in follow up in sleep clinic at [facility].  I certify that I have reviewed the raw data recording prior to the issuance of this report in accordance with the standards of the American Academy of Sleep Medicine (AASM).  Star Age, MD, PhD [facility]) Diplomat, ABPN (Neurology and Sleep)             Sleep Summary  Oxygen Saturation Statistics   Start Study Time: End Study Time: Total Recording Time:  10:24:21 PM   7:07:58 AM   8 h, 43 min  Total Sleep Time % REM of Sleep Time:  7 h, 41 min  23.2    Mean: 96 Minimum: 92 Maximum: 99  Mean of Desaturations Nadirs (%):   94  Oxygen Desaturation. %: 4-9 10-20 >20 Total  Events Number Total  12 100.0  0 0.0  0 0.0  12 100.0  Oxygen Saturation: <90 <=88 <85 <80 <70  Duration (minutes): Sleep % 0.0 0.0 0.0 0.0 0.0 0.0 0.0 0.0 0.0 0.0     Respiratory Indices      Total Events REM NREM All Night  pRDI:  61  pAHI:  54 ODI:  12  pAHIc:  1  % CSR: 0.0 14.1 14.1 4.7 0.0 11.9 10.3 2.1 0.2 12.2 10.8 2.4 0.2       Pulse Rate Statistics during Sleep (BPM)      Mean:  73 Minimum: 55 Maximum: 100    Indices are calculated using technically valid sleep time of  5 hrs, 0 min. pRDI/pAHI  are calculated using oxi desaturations ? 3%  Body Position Statistics  Position Supine Prone Right Left Non-Supine  Sleep (min) 34.0 168.0 68.5 191.0 427.5  Sleep % 7.4 36.4 14.8 41.4 92.6  pRDI 5.0 15.6 13.4 11.1 12.8  pAHI 5.0 15.6 13.4 8.3 11.3  ODI 0.0 7.4 1.3 0.4 2.6     Snoring Statistics Snoring Level (dB) >40 >50 >60 >70 >80 >Threshold (45)  Sleep (min) 116.2 3.2 1.0 0.1 0.0 8.4  Sleep % 25.2 0.7 0.2 0.0 0.0 1.8    Mean: 41 dB Sleep Stages Chart                 pAHI=10.8                             Mild              Moderate                    Severe                                                  5              15                    30

## 2019-01-27 NOTE — Telephone Encounter (Signed)
-----   Message from Star Age, MD sent at 01/27/2019  8:43 AM EDT ----- Patient referred by PCP NP, seen by me on 12/29/18, HST on 01/24/19.    Please call and notify the patient that the recent home sleep test showed obstructive sleep apnea. OSA is overall mild, but may be worth treating to see if she feels better after treatment. Overall AHI was 10.8/hour, O2 nadir 92%. Options for treatment are autoPAP machine at home, through a DME company (of her choice, or as per insurance requirement). The DME representative will educate her on how to use the machine, how to put the mask on, etc. She can work with a dentist to be fitted with an oral appl. If need be, we can make a referral to a specialized dentist.  Weight loss and avoiding the supine sleep position may also be an option.  autoPAP is likely to be more successful the quickest. I can put an order in, if she would like treatment. It may help her sleepiness and cognitive complaints.  Please let me know, how she would like to proceed.  Star Age, MD, PhD [facility])

## 2019-01-28 NOTE — Telephone Encounter (Signed)
Christina [name] I responded to patient about calling and scheduling her B12 injection

## 2019-01-28 NOTE — Telephone Encounter (Signed)
Noted and agree. 

## 2019-02-03 ENCOUNTER — Ambulatory Visit (INDEPENDENT_AMBULATORY_CARE_PROVIDER_SITE_OTHER): Payer: BC Managed Care – PPO

## 2019-02-03 DIAGNOSIS — E538 Deficiency of other specified B group vitamins: Secondary | ICD-10-CM

## 2019-02-03 MED ORDER — CYANOCOBALAMIN 1000 MCG/ML IJ SOLN
1000.0000 ug | Freq: Once | INTRAMUSCULAR | Status: AC
  Administered 2019-02-03: 1000 ug via INTRAMUSCULAR

## 2019-02-03 NOTE — Progress Notes (Signed)
Per orders of Dr. Duncan, injection of vit B12 given by Leeza Heiner. Patient tolerated injection well.  

## 2019-03-04 ENCOUNTER — Other Ambulatory Visit: Payer: Self-pay

## 2019-03-04 ENCOUNTER — Emergency Department
Admission: EM | Admit: 2019-03-04 | Discharge: 2019-03-04 | Disposition: A | Discharge status: Home / Self Care | Payer: BLUE CROSS/BLUE SHIELD | Attending: Emergency Medicine | Admitting: Emergency Medicine

## 2019-03-04 ENCOUNTER — Emergency Department: Payer: BLUE CROSS/BLUE SHIELD

## 2019-03-04 ENCOUNTER — Emergency Department
Admission: EM | Admit: 2019-03-04 | Discharge: 2019-03-05 | Disposition: A | Discharge status: Other Acute Inpatient Hospital | Payer: BLUE CROSS/BLUE SHIELD | Source: Home / Self Care

## 2019-03-04 DIAGNOSIS — Z7982 Long term (current) use of aspirin: Secondary | ICD-10-CM | POA: Insufficient documentation

## 2019-03-04 DIAGNOSIS — N6310 Unspecified lump in the right breast, unspecified quadrant: Secondary | ICD-10-CM | POA: Insufficient documentation

## 2019-03-04 DIAGNOSIS — R079 Chest pain, unspecified: Secondary | ICD-10-CM

## 2019-03-04 DIAGNOSIS — S199XXA Unspecified injury of neck, initial encounter: Secondary | ICD-10-CM | POA: Diagnosis not present

## 2019-03-04 DIAGNOSIS — S22019A Unspecified fracture of first thoracic vertebra, initial encounter for closed fracture: Secondary | ICD-10-CM | POA: Diagnosis not present

## 2019-03-04 DIAGNOSIS — M542 Cervicalgia: Secondary | ICD-10-CM

## 2019-03-04 DIAGNOSIS — I10 Essential (primary) hypertension: Secondary | ICD-10-CM | POA: Insufficient documentation

## 2019-03-04 DIAGNOSIS — Y999 Unspecified external cause status: Secondary | ICD-10-CM | POA: Insufficient documentation

## 2019-03-04 DIAGNOSIS — Y92411 Interstate highway as the place of occurrence of the external cause: Secondary | ICD-10-CM | POA: Insufficient documentation

## 2019-03-04 DIAGNOSIS — S2222XA Fracture of body of sternum, initial encounter for closed fracture: Secondary | ICD-10-CM | POA: Insufficient documentation

## 2019-03-04 DIAGNOSIS — S0990XA Unspecified injury of head, initial encounter: Secondary | ICD-10-CM | POA: Diagnosis not present

## 2019-03-04 DIAGNOSIS — R51 Headache: Secondary | ICD-10-CM | POA: Diagnosis not present

## 2019-03-04 DIAGNOSIS — R109 Unspecified abdominal pain: Secondary | ICD-10-CM | POA: Insufficient documentation

## 2019-03-04 DIAGNOSIS — Y9389 Activity, other specified: Secondary | ICD-10-CM | POA: Insufficient documentation

## 2019-03-04 DIAGNOSIS — S12120A Other displaced dens fracture, initial encounter for closed fracture: Secondary | ICD-10-CM | POA: Insufficient documentation

## 2019-03-04 DIAGNOSIS — S12090A Other displaced fracture of first cervical vertebra, initial encounter for closed fracture: Secondary | ICD-10-CM | POA: Diagnosis not present

## 2019-03-04 DIAGNOSIS — K5790 Diverticulosis of intestine, part unspecified, without perforation or abscess without bleeding: Secondary | ICD-10-CM | POA: Diagnosis not present

## 2019-03-04 DIAGNOSIS — N200 Calculus of kidney: Secondary | ICD-10-CM | POA: Diagnosis not present

## 2019-03-04 DIAGNOSIS — Z79899 Other long term (current) drug therapy: Secondary | ICD-10-CM | POA: Diagnosis not present

## 2019-03-04 LAB — CBC WITH DIFFERENTIAL/PLATELET
Abs Immature Granulocytes: 0.15 10*3/uL — ABNORMAL HIGH (ref 0.00–0.07)
Basophils Absolute: 0 10*3/uL (ref 0.0–0.1)
Basophils Relative: 1 %
Eosinophils Absolute: 0 10*3/uL (ref 0.0–0.5)
Eosinophils Relative: 0 %
HCT: 41.1 % (ref 36.0–46.0)
Hemoglobin: 13.5 g/dL (ref 12.0–15.0)
Immature Granulocytes: 2 %
Lymphocytes Relative: 31 %
Lymphs Abs: 2.4 10*3/uL (ref 0.7–4.0)
MCH: 30.8 pg (ref 26.0–34.0)
MCHC: 32.8 g/dL (ref 30.0–36.0)
MCV: 93.6 fL (ref 80.0–100.0)
Monocytes Absolute: 0.6 10*3/uL (ref 0.1–1.0)
Monocytes Relative: 8 %
Neutro Abs: 4.5 10*3/uL (ref 1.7–7.7)
Neutrophils Relative %: 58 %
Platelets: 318 10*3/uL (ref 150–400)
RBC: 4.39 MIL/uL (ref 3.87–5.11)
RDW: 12.2 % (ref 11.5–15.5)
WBC: 7.8 10*3/uL (ref 4.0–10.5)
nRBC: 0 % (ref 0.0–0.2)

## 2019-03-04 LAB — BASIC METABOLIC PANEL
Anion gap: 11 (ref 5–15)
BUN: 18 mg/dL (ref 8–23)
CO2: 24 mmol/L (ref 22–32)
Calcium: 9.2 mg/dL (ref 8.9–10.3)
Chloride: 103 mmol/L (ref 98–111)
Creatinine, Ser: 0.75 mg/dL (ref 0.44–1.00)
GFR calc Af Amer: >60 mL/min (ref >60)
GFR calc non Af Amer: >60 mL/min (ref >60)
Glucose, Bld: 118 mg/dL — ABNORMAL HIGH (ref 70–99)
Potassium: 3.6 mmol/L (ref 3.5–5.1)
Sodium: 138 mmol/L (ref 135–145)

## 2019-03-04 MED ORDER — IOHEXOL 300 MG/ML  SOLN
100.0000 mL | Freq: Once | INTRAMUSCULAR | Status: AC | PRN
  Administered 2019-03-04: 100 mL via INTRAVENOUS

## 2019-03-04 MED ORDER — MORPHINE SULFATE (PF) 4 MG/ML IV SOLN
4.0000 mg | Freq: Once | INTRAVENOUS | Status: AC
  Administered 2019-03-04: 19:00:00 4 mg via INTRAVENOUS
  Filled 2019-03-04: qty 1

## 2019-03-04 MED ORDER — ONDANSETRON HCL 4 MG/2ML IJ SOLN
4.0000 mg | Freq: Once | INTRAMUSCULAR | Status: AC
  Administered 2019-03-04: 4 mg via INTRAVENOUS

## 2019-03-04 MED ORDER — LACTATED RINGERS IV BOLUS
1000.0000 mL | Freq: Once | INTRAVENOUS | Status: AC
  Administered 2019-03-04: 19:00:00 1000 mL via INTRAVENOUS

## 2019-03-04 MED ORDER — HYDROMORPHONE HCL 1 MG/ML IJ SOLN
1.0000 mg | Freq: Once | INTRAMUSCULAR | Status: AC
  Administered 2019-03-04: 1 mg via INTRAVENOUS
  Filled 2019-03-04: qty 1

## 2019-03-04 MED ORDER — ONDANSETRON HCL 4 MG/2ML IJ SOLN: 4.0000 mg | Freq: Once | INTRAMUSCULAR | Status: DC

## 2019-03-04 MED ORDER — MORPHINE SULFATE (PF) 4 MG/ML IV SOLN
4.0000 mg | Freq: Once | INTRAVENOUS | Status: AC
  Administered 2019-03-04: 4 mg via INTRAVENOUS

## 2019-03-04 MED ORDER — MORPHINE SULFATE (PF) 4 MG/ML IV SOLN: 4.0000 mg | Freq: Once | INTRAVENOUS | Status: DC

## 2019-03-04 MED ORDER — HYDROMORPHONE HCL 1 MG/ML IJ SOLN
1.0000 mg | Freq: Once | INTRAMUSCULAR | Status: AC
Start: 2019-03-04 — End: 2019-03-04
  Administered 2019-03-04: 1 mg via INTRAVENOUS
  Filled 2019-03-04: qty 1
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
## 2019-03-04 NOTE — ED Notes (Signed)
Pt reporting she could not go to CT due to the amount of pain she is in. Pain medication given and RN will reassess shortly.

## 2019-03-04 NOTE — ED Notes (Signed)
Report given to Yessica RN  

## 2019-03-04 NOTE — ED Notes (Signed)
Per Registration pt's chart needs to be merged. All information documented on pt demographics was incorrect at the time. IT supposedly working on chart. At the moment nothing showing from triage assessment earlier.
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
## 2019-03-04 NOTE — ED Notes (Signed)
Patient transported to CT 

## 2019-03-05 DIAGNOSIS — Y33XXXA Other specified events, undetermined intent, initial encounter: Secondary | ICD-10-CM | POA: Diagnosis not present

## 2019-03-05 DIAGNOSIS — S2222XA Fracture of body of sternum, initial encounter for closed fracture: Secondary | ICD-10-CM | POA: Diagnosis not present

## 2019-03-05 DIAGNOSIS — S12100A Unspecified displaced fracture of second cervical vertebra, initial encounter for closed fracture: Secondary | ICD-10-CM | POA: Diagnosis not present

## 2019-03-05 DIAGNOSIS — S2220XA Unspecified fracture of sternum, initial encounter for closed fracture: Secondary | ICD-10-CM | POA: Diagnosis not present

## 2019-03-05 DIAGNOSIS — T07XXXA Unspecified multiple injuries, initial encounter: Secondary | ICD-10-CM | POA: Diagnosis not present

## 2019-03-05 DIAGNOSIS — Z20828 Contact with and (suspected) exposure to other viral communicable diseases: Secondary | ICD-10-CM | POA: Diagnosis not present

## 2019-03-05 DIAGNOSIS — S199XXA Unspecified injury of neck, initial encounter: Secondary | ICD-10-CM | POA: Diagnosis not present

## 2019-03-05 DIAGNOSIS — Z7982 Long term (current) use of aspirin: Secondary | ICD-10-CM | POA: Diagnosis not present

## 2019-03-05 DIAGNOSIS — Z79899 Other long term (current) drug therapy: Secondary | ICD-10-CM | POA: Diagnosis not present

## 2019-03-05 DIAGNOSIS — S5011XA Contusion of right forearm, initial encounter: Secondary | ICD-10-CM | POA: Diagnosis not present

## 2019-03-05 DIAGNOSIS — S12110A Anterior displaced Type II dens fracture, initial encounter for closed fracture: Secondary | ICD-10-CM | POA: Diagnosis not present

## 2019-03-05 DIAGNOSIS — M50322 Other cervical disc degeneration at C5-C6 level: Secondary | ICD-10-CM | POA: Diagnosis not present

## 2019-03-05 DIAGNOSIS — Y999 Unspecified external cause status: Secondary | ICD-10-CM | POA: Diagnosis not present

## 2019-03-05 DIAGNOSIS — R109 Unspecified abdominal pain: Secondary | ICD-10-CM | POA: Diagnosis not present

## 2019-03-05 DIAGNOSIS — S12120A Other displaced dens fracture, initial encounter for closed fracture: Secondary | ICD-10-CM | POA: Diagnosis not present

## 2019-03-05 DIAGNOSIS — S12000A Unspecified displaced fracture of first cervical vertebra, initial encounter for closed fracture: Secondary | ICD-10-CM | POA: Diagnosis not present

## 2019-03-05 DIAGNOSIS — Y92411 Interstate highway as the place of occurrence of the external cause: Secondary | ICD-10-CM | POA: Diagnosis not present

## 2019-03-05 DIAGNOSIS — I10 Essential (primary) hypertension: Secondary | ICD-10-CM | POA: Diagnosis not present

## 2019-03-05 DIAGNOSIS — I7774 Dissection of vertebral artery: Secondary | ICD-10-CM | POA: Diagnosis not present

## 2019-03-05 DIAGNOSIS — S12090A Other displaced fracture of first cervical vertebra, initial encounter for closed fracture: Secondary | ICD-10-CM | POA: Diagnosis not present

## 2019-03-05 DIAGNOSIS — M8580 Other specified disorders of bone density and structure, unspecified site: Secondary | ICD-10-CM | POA: Diagnosis not present

## 2019-03-05 DIAGNOSIS — N6310 Unspecified lump in the right breast, unspecified quadrant: Secondary | ICD-10-CM | POA: Diagnosis not present

## 2019-03-05 DIAGNOSIS — M47812 Spondylosis without myelopathy or radiculopathy, cervical region: Secondary | ICD-10-CM | POA: Diagnosis not present

## 2019-03-05 DIAGNOSIS — R51 Headache: Secondary | ICD-10-CM | POA: Diagnosis not present

## 2019-03-05 DIAGNOSIS — Z888 Allergy status to other drugs, medicaments and biological substances status: Secondary | ICD-10-CM | POA: Diagnosis not present

## 2019-03-05 LAB — SARS CORONAVIRUS 2 BY RT PCR (HOSPITAL ORDER, PERFORMED IN ~~LOC~~ HOSPITAL LAB): SARS Coronavirus 2: NEGATIVE

## 2019-03-05 MED ORDER — MORPHINE SULFATE (PF) 4 MG/ML IV SOLN
4.0000 mg | Freq: Once | INTRAVENOUS | Status: AC
  Administered 2019-03-05: 4 mg via INTRAVENOUS
  Filled 2019-03-05: qty 1

## 2019-03-11 ENCOUNTER — Telehealth: Payer: Self-pay | Admitting: Primary Care

## 2019-03-11 ENCOUNTER — Other Ambulatory Visit: Payer: Self-pay | Admitting: Family Medicine

## 2019-03-11 DIAGNOSIS — F419 Anxiety disorder, unspecified: Secondary | ICD-10-CM

## 2019-03-11 MED ORDER — POLYETHYLENE GLYCOL 3350 17 G PO PACK
17.00 | PACK | ORAL | Status: DC
Start: 2019-03-11 — End: 2019-03-11

## 2019-03-11 MED ORDER — METHOCARBAMOL 500 MG PO TABS
750.00 | ORAL_TABLET | ORAL | Status: DC
Start: 2019-03-10 — End: 2019-03-11

## 2019-03-11 MED ORDER — LABETALOL HCL 5 MG/ML IV SOLN
10.00 | INTRAVENOUS | Status: DC
Start: ? — End: 2019-03-11

## 2019-03-11 MED ORDER — LOVENOX 150 MG/ML ~~LOC~~ SOLN
0.25 | SUBCUTANEOUS | Status: DC
Start: ? — End: 2019-03-11

## 2019-03-11 MED ORDER — DIAZEPAM 5 MG PO TABS: 2.5000 mg | ORAL_TABLET | Freq: Three times a day (TID) | ORAL | 0 refills | Status: DC | PRN

## 2019-03-11 MED ORDER — DERMACAINE-ALOE 6-0.1 % EX CREA
2.00 | TOPICAL_CREAM | CUTANEOUS | Status: DC
Start: ? — End: 2019-03-11

## 2019-03-11 MED ORDER — ACETAMINOPHEN 325 MG PO TABS
975.00 | ORAL_TABLET | ORAL | Status: DC
Start: 2019-03-11 — End: 2019-03-11

## 2019-03-11 MED ORDER — Medication
30.00 | Status: DC
Start: ? — End: 2019-03-11

## 2019-03-11 MED ORDER — PHENYLEPHRINE-GUAIFENESIN 20-375 MG PO CP12
10.00 | ORAL_CAPSULE | ORAL | Status: DC
Start: ? — End: 2019-03-11

## 2019-03-11 MED ORDER — EQUATE NICOTINE 4 MG MT GUM
4.00 | CHEWING_GUM | OROMUCOSAL | Status: DC
Start: ? — End: 2019-03-11

## 2019-03-11 MED ORDER — BACITRACIN-NEOMYCIN-POLYMYXIN 400-5-5000 EX OINT
TOPICAL_OINTMENT | CUTANEOUS | Status: DC
Start: 2019-03-11 — End: 2019-03-11

## 2019-03-11 MED ORDER — DIAZEPAM 2 MG PO TABS
2.00 | ORAL_TABLET | ORAL | Status: DC
Start: ? — End: 2019-03-11

## 2019-03-11 MED ORDER — LIDOCAINE 5 % EX PTCH
2.00 | MEDICATED_PATCH | CUTANEOUS | Status: DC
Start: 2019-03-11 — End: 2019-03-11

## 2019-03-11 MED ORDER — Medication
2.00 | Status: DC
Start: 2019-03-11 — End: 2019-03-11

## 2019-03-11 MED ORDER — GABAPENTIN 300 MG PO CAPS
300.00 | ORAL_CAPSULE | ORAL | Status: DC
Start: 2019-03-10 — End: 2019-03-11

## 2019-03-11 MED ORDER — ASPIRIN 81 MG PO CHEW
81.00 | CHEWABLE_TABLET | ORAL | Status: DC
Start: 2019-03-11 — End: 2019-03-11

## 2019-03-11 NOTE — Telephone Encounter (Signed)
Called and spoke with patient. She reports being given diazepam in the hospital, alternating with pain medication. Discussed short term prescription, potential for side effects including over sedation, tolerance, dependence. She verbalized understanding to take as little medication as possible to control symptoms.

## 2019-03-11 NOTE — Telephone Encounter (Signed)
Please advise in [place] absence.   Noted. There is only one pharmacy on file.

## 2019-03-11 NOTE — Telephone Encounter (Signed)
FYI

## 2019-03-11 NOTE — Telephone Encounter (Signed)
[facility]    Patient requested a call back
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
## 2019-03-12 DIAGNOSIS — S2220XD Unspecified fracture of sternum, subsequent encounter for fracture with routine healing: Secondary | ICD-10-CM

## 2019-03-12 DIAGNOSIS — S12001D Unspecified nondisplaced fracture of first cervical vertebra, subsequent encounter for fracture with routine healing: Secondary | ICD-10-CM

## 2019-03-12 MED ORDER — ASPIRIN 81 MG PO CHEW
81.00 | CHEWABLE_TABLET | ORAL | Status: DC
Start: 2019-03-11 — End: 2019-03-12

## 2019-03-12 MED ORDER — ONDANSETRON HCL 4 MG/2ML IJ SOLN
4.00 | INTRAMUSCULAR | Status: DC
Start: ? — End: 2019-03-12

## 2019-03-12 MED ORDER — ENUCLENE 0.25 % OP SOLN
OPHTHALMIC | Status: DC
Start: 2019-03-11 — End: 2019-03-12

## 2019-03-12 MED ORDER — LABETALOL HCL 5 MG/ML IV SOLN
10.00 | INTRAVENOUS | Status: DC
Start: ? — End: 2019-03-12

## 2019-03-12 MED ORDER — Medication
2.00 | Status: DC
Start: 2019-03-11 — End: 2019-03-12

## 2019-03-12 NOTE — Telephone Encounter (Signed)
Noted.  See my chart message to patient. Will not be prescribing long term diazepam for anxiety as this is not best practice.

## 2019-03-15 MED ORDER — OXYCODONE HCL 5 MG PO TABS: 5.0000 mg | ORAL_TABLET | Freq: Four times a day (QID) | ORAL | 0 refills | Status: DC | PRN

## 2019-03-15 NOTE — Telephone Encounter (Signed)
Christina [name], see notes below. We need to get in touch with Dr. Havery Moros office in [place] [phone] and notify them that this patient is taking 4 mg of Dilaudid every four hours for her continued pain with only temporary improvement. She is asking for more medication for which I cannot provide. I did send in oxycodone 5 mg to use every 6 hours PRN. I recommend they consider getting her in sooner for follow up given the severity of her pain and continued need for scheduled Dilaudid/narcotics.

## 2019-03-15 NOTE — Telephone Encounter (Signed)
Called patient given that she is taking such high doses of Dilaudid, 4 mg every four hours which is over 50 mg of Morphine daily.  Discussed that this was a very high dose of pain medication and that she needs to have a frank discussion with her neurosurgeon regarding her amount of pain. I also discussed that I will switch her over to oxycodone 5 mg every 6 hours to use as needed and sparingly for a temporary time. She will need to talk about pain management with her neurosurgeon. We will also give her neurosurgeon's office through [place], Dr. Doyne Keel, a call and notify them of our plan.  No suspicious activity noted on PMP aware web site.

## 2019-03-15 NOTE — Telephone Encounter (Signed)
Patient called stating she really needed to speak to you as soon as possible.  Right now you do not have anything opened until Monday. Unless you would like to open a slot of the patient.  She stated they found a spot in her breast and she wants to discuss it with you.

## 2019-03-17 ENCOUNTER — Ambulatory Visit: Payer: BC Managed Care – PPO | Admitting: Physical Therapy

## 2019-03-18 DIAGNOSIS — S13120D Subluxation of C1/C2 cervical vertebrae, subsequent encounter: Secondary | ICD-10-CM | POA: Diagnosis not present

## 2019-03-18 DIAGNOSIS — S12100B Unspecified displaced fracture of second cervical vertebra, initial encounter for open fracture: Secondary | ICD-10-CM | POA: Diagnosis not present

## 2019-03-18 DIAGNOSIS — I10 Essential (primary) hypertension: Secondary | ICD-10-CM | POA: Diagnosis not present

## 2019-03-18 DIAGNOSIS — S12000D Unspecified displaced fracture of first cervical vertebra, subsequent encounter for fracture with routine healing: Secondary | ICD-10-CM | POA: Diagnosis not present

## 2019-03-18 DIAGNOSIS — R269 Unspecified abnormalities of gait and mobility: Secondary | ICD-10-CM | POA: Diagnosis not present

## 2019-03-18 DIAGNOSIS — M6281 Muscle weakness (generalized): Secondary | ICD-10-CM | POA: Diagnosis not present

## 2019-03-18 NOTE — Telephone Encounter (Signed)
Spoken to [facility] Neurology and was told that her appointment with DR Aris Lot is the earliest  for a new patient for appointment.

## 2019-03-21 ENCOUNTER — Encounter: Payer: BC Managed Care – PPO | Admitting: Physical Therapy

## 2019-03-22 ENCOUNTER — Telehealth: Payer: Self-pay | Admitting: *Deleted

## 2019-03-22 DIAGNOSIS — M4312 Spondylolisthesis, cervical region: Secondary | ICD-10-CM | POA: Diagnosis not present

## 2019-03-22 DIAGNOSIS — S12100D Unspecified displaced fracture of second cervical vertebra, subsequent encounter for fracture with routine healing: Secondary | ICD-10-CM | POA: Diagnosis not present

## 2019-03-22 DIAGNOSIS — S12001D Unspecified nondisplaced fracture of first cervical vertebra, subsequent encounter for fracture with routine healing: Secondary | ICD-10-CM | POA: Diagnosis not present

## 2019-03-22 DIAGNOSIS — S12000D Unspecified displaced fracture of first cervical vertebra, subsequent encounter for fracture with routine healing: Secondary | ICD-10-CM | POA: Diagnosis not present

## 2019-03-22 DIAGNOSIS — S12110G Anterior displaced Type II dens fracture, subsequent encounter for fracture with delayed healing: Secondary | ICD-10-CM | POA: Diagnosis not present

## 2019-03-22 DIAGNOSIS — S12041D Nondisplaced lateral mass fracture of first cervical vertebra, subsequent encounter for fracture with routine healing: Secondary | ICD-10-CM | POA: Diagnosis not present
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
## 2019-03-22 NOTE — Telephone Encounter (Signed)
Telephone call, had been in contact with Zigmund Daniel prior to discuss breast nodule follow-up but was waiting on neurologists plan.  We will not be able to do diagnostic right mammogram with ultrasound until at least 8 weeks.  Unable to remove halo traction.  States will call after next neurology visit with update.  Wished her well and hope she gets well soon.

## 2019-03-23 ENCOUNTER — Telehealth: Payer: Self-pay | Admitting: Primary Care

## 2019-03-23 DIAGNOSIS — I10 Essential (primary) hypertension: Secondary | ICD-10-CM | POA: Diagnosis not present

## 2019-03-23 DIAGNOSIS — S12000D Unspecified displaced fracture of first cervical vertebra, subsequent encounter for fracture with routine healing: Secondary | ICD-10-CM | POA: Diagnosis not present

## 2019-03-23 DIAGNOSIS — R269 Unspecified abnormalities of gait and mobility: Secondary | ICD-10-CM | POA: Diagnosis not present

## 2019-03-23 DIAGNOSIS — M6281 Muscle weakness (generalized): Secondary | ICD-10-CM | POA: Diagnosis not present

## 2019-03-23 DIAGNOSIS — S13120D Subluxation of C1/C2 cervical vertebrae, subsequent encounter: Secondary | ICD-10-CM | POA: Diagnosis not present

## 2019-03-23 DIAGNOSIS — S12100B Unspecified displaced fracture of second cervical vertebra, initial encounter for open fracture: Secondary | ICD-10-CM | POA: Diagnosis not present

## 2019-03-23 NOTE — Telephone Encounter (Signed)
Pt needs a disability placard parking form fillled out and she will pick it up in the morning.  Thanks

## 2019-03-24 NOTE — Telephone Encounter (Signed)
Noted and placed in front office.

## 2019-03-25 ENCOUNTER — Ambulatory Visit: Payer: BC Managed Care – PPO | Admitting: Physical Therapy

## 2019-03-26 ENCOUNTER — Other Ambulatory Visit: Payer: Self-pay

## 2019-03-26 ENCOUNTER — Emergency Department (HOSPITAL_COMMUNITY)
Admission: EM | Admit: 2019-03-26 | Discharge: 2019-03-27 | Disposition: A | Discharge status: Home / Self Care | Payer: BC Managed Care – PPO | Attending: Emergency Medicine | Admitting: Emergency Medicine

## 2019-03-26 ENCOUNTER — Emergency Department (HOSPITAL_COMMUNITY): Payer: BC Managed Care – PPO

## 2019-03-26 DIAGNOSIS — I10 Essential (primary) hypertension: Secondary | ICD-10-CM | POA: Diagnosis not present

## 2019-03-26 DIAGNOSIS — R51 Headache: Secondary | ICD-10-CM | POA: Diagnosis not present

## 2019-03-26 DIAGNOSIS — R251 Tremor, unspecified: Secondary | ICD-10-CM | POA: Diagnosis not present

## 2019-03-26 DIAGNOSIS — R110 Nausea: Secondary | ICD-10-CM | POA: Insufficient documentation

## 2019-03-26 DIAGNOSIS — S0990XA Unspecified injury of head, initial encounter: Secondary | ICD-10-CM | POA: Diagnosis not present

## 2019-03-26 DIAGNOSIS — Z79899 Other long term (current) drug therapy: Secondary | ICD-10-CM | POA: Insufficient documentation

## 2019-03-26 DIAGNOSIS — R29818 Other symptoms and signs involving the nervous system: Secondary | ICD-10-CM | POA: Diagnosis not present

## 2019-03-26 LAB — URINALYSIS, ROUTINE W REFLEX MICROSCOPIC
Bilirubin Urine: NEGATIVE
Glucose, UA: NEGATIVE mg/dL
Hgb urine dipstick: NEGATIVE
Ketones, ur: NEGATIVE mg/dL
Nitrite: NEGATIVE
Protein, ur: NEGATIVE mg/dL
Specific Gravity, Urine: 1.017 (ref 1.005–1.030)
WBC, UA: >50 WBC/hpf — ABNORMAL HIGH (ref 0–5)
pH: 5 (ref 5.0–8.0)

## 2019-03-26 LAB — COMPREHENSIVE METABOLIC PANEL
ALT: 34 U/L (ref 0–44)
AST: 25 U/L (ref 15–41)
Albumin: 4.1 g/dL (ref 3.5–5.0)
Alkaline Phosphatase: 125 U/L (ref 38–126)
Anion gap: 12 (ref 5–15)
BUN: 16 mg/dL (ref 8–23)
CO2: 25 mmol/L (ref 22–32)
Calcium: 9.5 mg/dL (ref 8.9–10.3)
Chloride: 99 mmol/L (ref 98–111)
Creatinine, Ser: 1.01 mg/dL — ABNORMAL HIGH (ref 0.44–1.00)
GFR calc Af Amer: >60 mL/min (ref >60)
GFR calc non Af Amer: >60 mL/min (ref >60)
Glucose, Bld: 131 mg/dL — ABNORMAL HIGH (ref 70–99)
Potassium: 4 mmol/L (ref 3.5–5.1)
Sodium: 136 mmol/L (ref 135–145)
Total Bilirubin: 0.5 mg/dL (ref 0.3–1.2)
Total Protein: 7.2 g/dL (ref 6.5–8.1)

## 2019-03-26 LAB — RAPID URINE DRUG SCREEN, HOSP PERFORMED
Amphetamines: NOT DETECTED
Barbiturates: NOT DETECTED
Benzodiazepines: POSITIVE — AB
Cocaine: NOT DETECTED
Opiates: NOT DETECTED
Tetrahydrocannabinol: NOT DETECTED

## 2019-03-26 LAB — CBC
HCT: 37.4 % (ref 36.0–46.0)
Hemoglobin: 12.4 g/dL (ref 12.0–15.0)
MCH: 32.7 pg (ref 26.0–34.0)
MCHC: 33.2 g/dL (ref 30.0–36.0)
MCV: 98.7 fL (ref 80.0–100.0)
Platelets: 372 10*3/uL (ref 150–400)
RBC: 3.79 MIL/uL — ABNORMAL LOW (ref 3.87–5.11)
RDW: 13.2 % (ref 11.5–15.5)
WBC: 11.2 10*3/uL — ABNORMAL HIGH (ref 4.0–10.5)
nRBC: 0 % (ref 0.0–0.2)

## 2019-03-26 LAB — CBG MONITORING, ED: Glucose-Capillary: 107 mg/dL — ABNORMAL HIGH (ref 70–99)

## 2019-03-26 MED ORDER — ACETAMINOPHEN 325 MG PO TABS
650.0000 mg | ORAL_TABLET | Freq: Once | ORAL | Status: AC
  Administered 2019-03-26: 650 mg via ORAL
  Filled 2019-03-26: qty 2
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
## 2019-03-26 NOTE — ED Notes (Signed)
Patient transported to CT 

## 2019-03-26 NOTE — Discharge Instructions (Addendum)
Ms. Christina, [name] were seen in the ED with concerns of limb jerking. Your CT was negative for stroke. However, it is unclear as to the cause of your symptoms. Please follow up with your neurologist at [facility]. If you continue to experience symptoms or experience unusual headaches, focal weakness, fevers or chills, please seek emergent care.   Thank you

## 2019-03-28 ENCOUNTER — Encounter: Payer: BC Managed Care – PPO | Admitting: Physical Therapy

## 2019-03-28 ENCOUNTER — Telehealth: Payer: Self-pay

## 2019-03-28 LAB — URINE CULTURE

## 2019-03-28 NOTE — Telephone Encounter (Signed)
Christina [name],  Can you please call patient get her scheduled for an ER follow up with Anda Kraft in the next week?

## 2019-03-29 DIAGNOSIS — S13120D Subluxation of C1/C2 cervical vertebrae, subsequent encounter: Secondary | ICD-10-CM | POA: Diagnosis not present

## 2019-03-29 DIAGNOSIS — M6281 Muscle weakness (generalized): Secondary | ICD-10-CM | POA: Diagnosis not present

## 2019-03-29 DIAGNOSIS — S12100B Unspecified displaced fracture of second cervical vertebra, initial encounter for open fracture: Secondary | ICD-10-CM | POA: Diagnosis not present

## 2019-03-29 DIAGNOSIS — I10 Essential (primary) hypertension: Secondary | ICD-10-CM | POA: Diagnosis not present

## 2019-03-29 DIAGNOSIS — S12000D Unspecified displaced fracture of first cervical vertebra, subsequent encounter for fracture with routine healing: Secondary | ICD-10-CM | POA: Diagnosis not present

## 2019-03-29 DIAGNOSIS — R269 Unspecified abnormalities of gait and mobility: Secondary | ICD-10-CM | POA: Diagnosis not present

## 2019-03-29 NOTE — Telephone Encounter (Signed)
lvm asking patient to call office °

## 2019-04-01 ENCOUNTER — Encounter: Payer: BC Managed Care – PPO | Admitting: Physical Therapy

## 2019-04-01 ENCOUNTER — Other Ambulatory Visit: Payer: Self-pay | Admitting: Women's Health

## 2019-04-01 DIAGNOSIS — B009 Herpesviral infection, unspecified: Secondary | ICD-10-CM

## 2019-04-05 ENCOUNTER — Encounter: Payer: BC Managed Care – PPO | Admitting: Physical Therapy

## 2019-04-07 DIAGNOSIS — I7774 Dissection of vertebral artery: Secondary | ICD-10-CM | POA: Diagnosis not present

## 2019-04-07 DIAGNOSIS — I776 Arteritis, unspecified: Secondary | ICD-10-CM | POA: Diagnosis not present

## 2019-04-07 DIAGNOSIS — S12110A Anterior displaced Type II dens fracture, initial encounter for closed fracture: Secondary | ICD-10-CM | POA: Diagnosis not present

## 2019-04-08 ENCOUNTER — Encounter: Payer: BC Managed Care – PPO | Admitting: Physical Therapy

## 2019-04-08 DIAGNOSIS — R269 Unspecified abnormalities of gait and mobility: Secondary | ICD-10-CM | POA: Diagnosis not present

## 2019-04-08 DIAGNOSIS — M6281 Muscle weakness (generalized): Secondary | ICD-10-CM | POA: Diagnosis not present

## 2019-04-08 DIAGNOSIS — S12100B Unspecified displaced fracture of second cervical vertebra, initial encounter for open fracture: Secondary | ICD-10-CM | POA: Diagnosis not present

## 2019-04-08 DIAGNOSIS — I10 Essential (primary) hypertension: Secondary | ICD-10-CM | POA: Diagnosis not present

## 2019-04-08 DIAGNOSIS — S13120D Subluxation of C1/C2 cervical vertebrae, subsequent encounter: Secondary | ICD-10-CM | POA: Diagnosis not present

## 2019-04-08 DIAGNOSIS — S12000D Unspecified displaced fracture of first cervical vertebra, subsequent encounter for fracture with routine healing: Secondary | ICD-10-CM | POA: Diagnosis not present

## 2019-04-12 ENCOUNTER — Encounter: Payer: BC Managed Care – PPO | Admitting: Physical Therapy

## 2019-04-12 DIAGNOSIS — S12100B Unspecified displaced fracture of second cervical vertebra, initial encounter for open fracture: Secondary | ICD-10-CM | POA: Diagnosis not present

## 2019-04-12 DIAGNOSIS — S12000D Unspecified displaced fracture of first cervical vertebra, subsequent encounter for fracture with routine healing: Secondary | ICD-10-CM | POA: Diagnosis not present

## 2019-04-12 DIAGNOSIS — R269 Unspecified abnormalities of gait and mobility: Secondary | ICD-10-CM | POA: Diagnosis not present

## 2019-04-12 DIAGNOSIS — I10 Essential (primary) hypertension: Secondary | ICD-10-CM | POA: Diagnosis not present

## 2019-04-12 DIAGNOSIS — M6281 Muscle weakness (generalized): Secondary | ICD-10-CM | POA: Diagnosis not present

## 2019-04-12 DIAGNOSIS — S13120D Subluxation of C1/C2 cervical vertebrae, subsequent encounter: Secondary | ICD-10-CM | POA: Diagnosis not present

## 2019-04-14 DIAGNOSIS — I10 Essential (primary) hypertension: Secondary | ICD-10-CM | POA: Diagnosis not present

## 2019-04-14 DIAGNOSIS — S12100B Unspecified displaced fracture of second cervical vertebra, initial encounter for open fracture: Secondary | ICD-10-CM | POA: Diagnosis not present

## 2019-04-14 DIAGNOSIS — S13120D Subluxation of C1/C2 cervical vertebrae, subsequent encounter: Secondary | ICD-10-CM | POA: Diagnosis not present

## 2019-04-14 DIAGNOSIS — S12000D Unspecified displaced fracture of first cervical vertebra, subsequent encounter for fracture with routine healing: Secondary | ICD-10-CM | POA: Diagnosis not present

## 2019-04-14 DIAGNOSIS — M6281 Muscle weakness (generalized): Secondary | ICD-10-CM | POA: Diagnosis not present

## 2019-04-14 DIAGNOSIS — R269 Unspecified abnormalities of gait and mobility: Secondary | ICD-10-CM | POA: Diagnosis not present

## 2019-04-15 ENCOUNTER — Encounter: Payer: BC Managed Care – PPO | Admitting: Physical Therapy

## 2019-04-15 DIAGNOSIS — S12120D Other displaced dens fracture, subsequent encounter for fracture with routine healing: Secondary | ICD-10-CM | POA: Diagnosis not present

## 2019-04-15 DIAGNOSIS — R29898 Other symptoms and signs involving the musculoskeletal system: Secondary | ICD-10-CM | POA: Diagnosis not present

## 2019-04-15 DIAGNOSIS — S12000D Unspecified displaced fracture of first cervical vertebra, subsequent encounter for fracture with routine healing: Secondary | ICD-10-CM | POA: Diagnosis not present

## 2019-04-15 DIAGNOSIS — S12190G Other displaced fracture of second cervical vertebra, subsequent encounter for fracture with delayed healing: Secondary | ICD-10-CM | POA: Diagnosis not present

## 2019-04-15 DIAGNOSIS — S12110G Anterior displaced Type II dens fracture, subsequent encounter for fracture with delayed healing: Secondary | ICD-10-CM | POA: Diagnosis not present

## 2019-04-15 DIAGNOSIS — X58XXXD Exposure to other specified factors, subsequent encounter: Secondary | ICD-10-CM | POA: Diagnosis not present

## 2019-04-15 DIAGNOSIS — Z9889 Other specified postprocedural states: Secondary | ICD-10-CM | POA: Diagnosis not present

## 2019-04-15 DIAGNOSIS — S12100A Unspecified displaced fracture of second cervical vertebra, initial encounter for closed fracture: Secondary | ICD-10-CM | POA: Diagnosis not present

## 2019-04-19 ENCOUNTER — Telehealth: Payer: Self-pay | Admitting: Women's Health

## 2019-04-19 ENCOUNTER — Encounter: Payer: BC Managed Care – PPO | Admitting: Physical Therapy
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
## 2019-04-20 DIAGNOSIS — S12000D Unspecified displaced fracture of first cervical vertebra, subsequent encounter for fracture with routine healing: Secondary | ICD-10-CM | POA: Diagnosis not present

## 2019-04-20 DIAGNOSIS — S12100B Unspecified displaced fracture of second cervical vertebra, initial encounter for open fracture: Secondary | ICD-10-CM | POA: Diagnosis not present

## 2019-04-20 DIAGNOSIS — I10 Essential (primary) hypertension: Secondary | ICD-10-CM | POA: Diagnosis not present

## 2019-04-20 DIAGNOSIS — M6281 Muscle weakness (generalized): Secondary | ICD-10-CM | POA: Diagnosis not present

## 2019-04-20 DIAGNOSIS — S13120D Subluxation of C1/C2 cervical vertebrae, subsequent encounter: Secondary | ICD-10-CM | POA: Diagnosis not present

## 2019-04-20 DIAGNOSIS — R269 Unspecified abnormalities of gait and mobility: Secondary | ICD-10-CM | POA: Diagnosis not present

## 2019-04-22 ENCOUNTER — Encounter: Payer: BC Managed Care – PPO | Admitting: Physical Therapy

## 2019-04-25 ENCOUNTER — Encounter: Payer: BC Managed Care – PPO | Admitting: Physical Therapy

## 2019-04-29 ENCOUNTER — Encounter: Payer: BC Managed Care – PPO | Admitting: Physical Therapy

## 2019-05-02 ENCOUNTER — Encounter: Payer: BC Managed Care – PPO | Admitting: Physical Therapy

## 2019-05-06 ENCOUNTER — Encounter: Payer: BC Managed Care – PPO | Admitting: Physical Therapy

## 2019-05-07 ENCOUNTER — Other Ambulatory Visit: Payer: Self-pay | Admitting: Women's Health

## 2019-05-07 DIAGNOSIS — B009 Herpesviral infection, unspecified: Secondary | ICD-10-CM

## 2019-05-09 ENCOUNTER — Encounter: Payer: BC Managed Care – PPO | Admitting: Physical Therapy

## 2019-05-13 ENCOUNTER — Encounter: Payer: BC Managed Care – PPO | Admitting: Physical Therapy

## 2019-05-13 DIAGNOSIS — S12190G Other displaced fracture of second cervical vertebra, subsequent encounter for fracture with delayed healing: Secondary | ICD-10-CM | POA: Diagnosis not present

## 2019-05-13 DIAGNOSIS — M5031 Other cervical disc degeneration,  high cervical region: Secondary | ICD-10-CM | POA: Diagnosis not present

## 2019-05-13 DIAGNOSIS — S12000D Unspecified displaced fracture of first cervical vertebra, subsequent encounter for fracture with routine healing: Secondary | ICD-10-CM | POA: Diagnosis not present

## 2019-05-13 DIAGNOSIS — S12110G Anterior displaced Type II dens fracture, subsequent encounter for fracture with delayed healing: Secondary | ICD-10-CM | POA: Diagnosis not present

## 2019-05-13 DIAGNOSIS — M4312 Spondylolisthesis, cervical region: Secondary | ICD-10-CM | POA: Diagnosis not present

## 2019-05-16 ENCOUNTER — Encounter: Payer: BC Managed Care – PPO | Admitting: Physical Therapy

## 2019-05-18 ENCOUNTER — Telehealth: Payer: Self-pay | Admitting: *Deleted
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
## 2019-05-18 NOTE — Telephone Encounter (Signed)
Patient scheduled on 05/30/19 @ 9:45am at [facility]. Order faxed,  Patient aware.

## 2019-05-20 ENCOUNTER — Encounter: Payer: BC Managed Care – PPO | Admitting: Physical Therapy

## 2019-05-23 ENCOUNTER — Encounter: Payer: BC Managed Care – PPO | Admitting: Physical Therapy

## 2019-05-25 ENCOUNTER — Encounter: Payer: BC Managed Care – PPO | Admitting: Physical Therapy

## 2019-05-30 ENCOUNTER — Telehealth: Payer: Self-pay | Admitting: *Deleted

## 2019-05-30 NOTE — Telephone Encounter (Signed)
Patient has diag. Mammogram and ultrasound scheduled today at [facility] today, she did bring imaging to appointment as well from Ct scan in Oct 2020. Patient said the radiologist did not recommend having any imaging done today, told patient to follow up at annual mammogram in Feb 2021. Per patient radiologist said right breast nodule has been there for "years" so no further imaging needed. Patient wanted you to be aware of this.

## 2019-05-31 NOTE — Telephone Encounter (Signed)
TC to [place] and reviewed results.  Will continue annual screens

## 2019-06-09 DIAGNOSIS — S12100D Unspecified displaced fracture of second cervical vertebra, subsequent encounter for fracture with routine healing: Secondary | ICD-10-CM | POA: Diagnosis not present

## 2019-06-09 DIAGNOSIS — X58XXXD Exposure to other specified factors, subsequent encounter: Secondary | ICD-10-CM | POA: Diagnosis not present

## 2019-06-09 DIAGNOSIS — S12112G Nondisplaced Type II dens fracture, subsequent encounter for fracture with delayed healing: Secondary | ICD-10-CM | POA: Diagnosis not present

## 2019-06-09 DIAGNOSIS — S12110D Anterior displaced Type II dens fracture, subsequent encounter for fracture with routine healing: Secondary | ICD-10-CM | POA: Diagnosis not present

## 2019-06-09 DIAGNOSIS — S12000D Unspecified displaced fracture of first cervical vertebra, subsequent encounter for fracture with routine healing: Secondary | ICD-10-CM | POA: Diagnosis not present

## 2019-06-09 DIAGNOSIS — M4312 Spondylolisthesis, cervical region: Secondary | ICD-10-CM | POA: Diagnosis not present

## 2019-06-09 DIAGNOSIS — S12110G Anterior displaced Type II dens fracture, subsequent encounter for fracture with delayed healing: Secondary | ICD-10-CM | POA: Diagnosis not present

## 2019-06-13 DIAGNOSIS — I10 Essential (primary) hypertension: Secondary | ICD-10-CM | POA: Diagnosis not present

## 2019-06-13 DIAGNOSIS — G4733 Obstructive sleep apnea (adult) (pediatric): Secondary | ICD-10-CM | POA: Diagnosis not present

## 2019-06-13 DIAGNOSIS — Z01818 Encounter for other preprocedural examination: Secondary | ICD-10-CM | POA: Diagnosis not present

## 2019-06-15 DIAGNOSIS — M542 Cervicalgia: Secondary | ICD-10-CM | POA: Diagnosis not present

## 2019-06-16 DIAGNOSIS — S12000D Unspecified displaced fracture of first cervical vertebra, subsequent encounter for fracture with routine healing: Secondary | ICD-10-CM | POA: Diagnosis not present

## 2019-06-16 DIAGNOSIS — Z981 Arthrodesis status: Secondary | ICD-10-CM | POA: Diagnosis not present

## 2019-06-16 DIAGNOSIS — S12110D Anterior displaced Type II dens fracture, subsequent encounter for fracture with routine healing: Secondary | ICD-10-CM | POA: Diagnosis not present

## 2019-06-16 DIAGNOSIS — M542 Cervicalgia: Secondary | ICD-10-CM | POA: Diagnosis not present

## 2019-06-17 DIAGNOSIS — M542 Cervicalgia: Secondary | ICD-10-CM | POA: Diagnosis not present

## 2019-06-18 MED ORDER — LIDOCAINE 5 % EX PTCH
2.00 | MEDICATED_PATCH | CUTANEOUS | Status: DC
Start: 2019-06-18 — End: 2019-06-18

## 2019-06-18 MED ORDER — METHOCARBAMOL 500 MG PO TABS
1000.00 | ORAL_TABLET | ORAL | Status: DC
Start: 2019-06-18 — End: 2019-06-18

## 2019-06-18 MED ORDER — SENNOSIDES-DOCUSATE SODIUM 8.6-50 MG PO TABS
2.00 | ORAL_TABLET | ORAL | Status: DC
Start: 2019-06-18 — End: 2019-06-18

## 2019-06-18 MED ORDER — CALCIUM CARBONATE-VITAMIN D 600-400 MG-UNIT PO TABS
1.00 | ORAL_TABLET | ORAL | Status: DC
Start: 2019-06-18 — End: 2019-06-18

## 2019-06-18 MED ORDER — HYDRALAZINE HCL 20 MG/ML IJ SOLN
10.00 | INTRAMUSCULAR | Status: DC
Start: ? — End: 2019-06-18

## 2019-06-18 MED ORDER — OXYCODONE HCL 5 MG PO TABS
5.00 | ORAL_TABLET | ORAL | Status: DC
Start: ? — End: 2019-06-18

## 2019-06-18 MED ORDER — QUINTABS PO TABS
1.00 | ORAL_TABLET | ORAL | Status: DC
Start: 2019-06-19 — End: 2019-06-18

## 2019-06-18 MED ORDER — GENERIC EXTERNAL MEDICATION
Status: DC
Start: ? — End: 2019-06-18

## 2019-06-18 MED ORDER — LABETALOL HCL 5 MG/ML IV SOLN
10.00 | INTRAVENOUS | Status: DC
Start: ? — End: 2019-06-18

## 2019-06-18 MED ORDER — LISINOPRIL 20 MG PO TABS
40.00 | ORAL_TABLET | ORAL | Status: DC
Start: 2019-06-19 — End: 2019-06-18

## 2019-06-18 MED ORDER — ENOXAPARIN SODIUM 40 MG/0.4ML ~~LOC~~ SOLN
40.00 | SUBCUTANEOUS | Status: DC
Start: 2019-06-19 — End: 2019-06-18

## 2019-06-18 MED ORDER — ZINC SULFATE 220 (50 ZN) MG PO CAPS
220.00 | ORAL_CAPSULE | ORAL | Status: DC
Start: 2019-06-19 — End: 2019-06-18

## 2019-06-18 MED ORDER — POLYETHYLENE GLYCOL 3350 17 GM/SCOOP PO POWD
17.00 | ORAL | Status: DC
Start: 2019-06-18 — End: 2019-06-18

## 2019-06-18 MED ORDER — ASCORBIC ACID 500 MG PO TABS
500.00 | ORAL_TABLET | ORAL | Status: DC
Start: 2019-06-18 — End: 2019-06-18

## 2019-06-18 MED ORDER — BUTALBITAL-APAP-CAFFEINE 50-325-40 MG PO TABS
1.00 | ORAL_TABLET | ORAL | Status: DC
Start: ? — End: 2019-06-18

## 2019-06-18 MED ORDER — ACETAMINOPHEN 325 MG PO TABS
650.00 | ORAL_TABLET | ORAL | Status: DC
Start: 2019-06-18 — End: 2019-06-18

## 2019-06-18 MED ORDER — BISACODYL 10 MG RE SUPP
10.00 | RECTAL | Status: DC
Start: ? — End: 2019-06-18

## 2019-06-27 ENCOUNTER — Other Ambulatory Visit: Payer: Self-pay | Admitting: Women's Health

## 2019-06-27 DIAGNOSIS — B009 Herpesviral infection, unspecified: Secondary | ICD-10-CM

## 2019-06-27 MED ORDER — VALACYCLOVIR HCL 500 MG PO TABS: ORAL_TABLET | ORAL | 6 refills | Status: DC

## 2019-08-24 ENCOUNTER — Encounter: Payer: Self-pay | Admitting: Women's Health

## 2019-09-22 ENCOUNTER — Ambulatory Visit: Payer: Self-pay | Attending: Internal Medicine

## 2019-09-22 DIAGNOSIS — Z23 Encounter for immunization: Secondary | ICD-10-CM

## 2019-09-22 NOTE — Progress Notes (Signed)
   Covid-19 Vaccination Clinic  Name:  Christina [name]    MRN: [id number] DOB: 07-17-1955  09/22/2019  [name] was observed post Covid-19 immunization for 15 minutes without incident. She was provided with Vaccine Information Sheet and instruction to access the V-Safe system.   [name] was instructed to call 911 with any severe reactions post vaccine: Marland Kitchen Difficulty breathing  . Swelling of face and throat  . A fast heartbeat  . A bad rash all over body  . Dizziness and weakness   Immunizations Administered    Name Date Dose VIS Date Route   Pfizer COVID-19 Vaccine 09/22/2019  8:29 AM 0.3 mL 06/10/2019 Intramuscular   Manufacturer: [employer], [employer]   Lot: [id number]   NDC: [address]-2

## 2019-10-10 ENCOUNTER — Other Ambulatory Visit: Payer: Self-pay

## 2019-10-11 ENCOUNTER — Encounter: Payer: Self-pay | Admitting: Women's Health

## 2019-10-11 ENCOUNTER — Ambulatory Visit (INDEPENDENT_AMBULATORY_CARE_PROVIDER_SITE_OTHER): Payer: 59 | Admitting: Women's Health

## 2019-10-11 VITALS — BP 120/80 | Ht 64.0 in | Wt 158.0 lb

## 2019-10-11 DIAGNOSIS — E538 Deficiency of other specified B group vitamins: Secondary | ICD-10-CM

## 2019-10-11 DIAGNOSIS — R7309 Other abnormal glucose: Secondary | ICD-10-CM | POA: Diagnosis not present

## 2019-10-11 DIAGNOSIS — Z01419 Encounter for gynecological examination (general) (routine) without abnormal findings: Secondary | ICD-10-CM

## 2019-10-11 DIAGNOSIS — Z13820 Encounter for screening for osteoporosis: Secondary | ICD-10-CM

## 2019-10-11 MED ORDER — ESTRADIOL 0.1 MG/GM VA CREA: 1.0000 | TOPICAL_CREAM | Freq: Every day | VAGINAL | 12 refills | Status: DC
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
## 2019-10-11 NOTE — Patient Instructions (Addendum)
Vit D 2000 iu daily Pleasure knowing you! shingrex  Vaccine,  2 series vaccine for shingles prevention  Health Maintenance for Postmenopausal Women Menopause is a normal process in which your ability to get pregnant comes to an end. This process happens slowly over many months or years, usually between the ages of 74 and 44. Menopause is complete when you have missed your menstrual periods for 12 months. It is important to talk with your health care provider about some of the most common conditions that affect women after menopause (postmenopausal women). These include heart disease, cancer, and bone loss (osteoporosis). Adopting a healthy lifestyle and getting preventive care can help to promote your health and wellness. The actions you take can also lower your chances of developing some of these common conditions. What should I know about menopause? During menopause, you may get a number of symptoms, such as:  Hot flashes. These can be moderate or severe.  Night sweats.  Decrease in sex drive.  Mood swings.  Headaches.  Tiredness.  Irritability.  Memory problems.  Insomnia. Choosing to treat or not to treat these symptoms is a decision that you make with your health care provider. Do I need hormone replacement therapy?  Hormone replacement therapy is effective in treating symptoms that are caused by menopause, such as hot flashes and night sweats.  Hormone replacement carries certain risks, especially as you become older. If you are thinking about using estrogen or estrogen with progestin, discuss the benefits and risks with your health care provider. What is my risk for heart disease and stroke? The risk of heart disease, heart attack, and stroke increases as you age. One of the causes may be a change in the body's hormones during menopause. This can affect how your body uses dietary fats, triglycerides, and cholesterol. Heart attack and stroke are medical emergencies. There are  many things that you can do to help prevent heart disease and stroke. Watch your blood pressure  High blood pressure causes heart disease and increases the risk of stroke. This is more likely to develop in people who have high blood pressure readings, are of African descent, or are overweight.  Have your blood pressure checked: ? Every 3-5 years if you are 62-54 years of age. ? Every year if you are 76 years old or older. Eat a healthy diet   Eat a diet that includes plenty of vegetables, fruits, low-fat dairy products, and lean protein.  Do not eat a lot of foods that are high in solid fats, added sugars, or sodium. Get regular exercise Get regular exercise. This is one of the most important things you can do for your health. Most adults should:  Try to exercise for at least 150 minutes each week. The exercise should increase your heart rate and make you sweat (moderate-intensity exercise).  Try to do strengthening exercises at least twice each week. Do these in addition to the moderate-intensity exercise.  Spend less time sitting. Even light physical activity can be beneficial. Other tips  Work with your health care provider to achieve or maintain a healthy weight.  Do not use any products that contain nicotine or tobacco, such as cigarettes, e-cigarettes, and chewing tobacco. If you need help quitting, ask your health care provider.  Know your numbers. Ask your health care provider to check your cholesterol and your blood sugar (glucose). Continue to have your blood tested as directed by your health care provider. Do I need screening for cancer? Depending on your  health history and family history, you may need to have cancer screening at different stages of your life. This may include screening for:  Breast cancer.  Cervical cancer.  Lung cancer.  Colorectal cancer. What is my risk for osteoporosis? After menopause, you may be at increased risk for osteoporosis.  Osteoporosis is a condition in which bone destruction happens more quickly than new bone creation. To help prevent osteoporosis or the bone fractures that can happen because of osteoporosis, you may take the following actions:  If you are 9-66 years old, get at least 1,000 mg of calcium and at least 600 mg of vitamin D per day.  If you are older than age 63 but younger than age 68, get at least 1,200 mg of calcium and at least 600 mg of vitamin D per day.  If you are older than age 55, get at least 1,200 mg of calcium and at least 800 mg of vitamin D per day. Smoking and drinking excessive alcohol increase the risk of osteoporosis. Eat foods that are rich in calcium and vitamin D, and do weight-bearing exercises several times each week as directed by your health care provider. How does menopause affect my mental health? Depression may occur at any age, but it is more common as you become older. Common symptoms of depression include:  Low or sad mood.  Changes in sleep patterns.  Changes in appetite or eating patterns.  Feeling an overall lack of motivation or enjoyment of activities that you previously enjoyed.  Frequent crying spells. Talk with your health care provider if you think that you are experiencing depression. General instructions See your health care provider for regular wellness exams and vaccines. This may include:  Scheduling regular health, dental, and eye exams.  Getting and maintaining your vaccines. These include: ? Influenza vaccine. Get this vaccine each year before the flu season begins. ? Pneumonia vaccine. ? Shingles vaccine. ? Tetanus, diphtheria, and pertussis (Tdap) booster vaccine. Your health care provider may also recommend other immunizations. Tell your health care provider if you have ever been abused or do not feel safe at home. Summary  Menopause is a normal process in which your ability to get pregnant comes to an end.  This condition causes  hot flashes, night sweats, decreased interest in sex, mood swings, headaches, or lack of sleep.  Treatment for this condition may include hormone replacement therapy.  Take actions to keep yourself healthy, including exercising regularly, eating a healthy diet, watching your weight, and checking your blood pressure and blood sugar levels.  Get screened for cancer and depression. Make sure that you are up to date with all your vaccines. This information is not intended to replace advice given to you by your health care provider. Make sure you discuss any questions you have with your health care provider. Document Revised: 06/09/2018 Document Reviewed: 06/09/2018 Elsevier Patient Education  2020 [employer].

## 2019-10-12 ENCOUNTER — Other Ambulatory Visit: Payer: Self-pay | Admitting: Primary Care

## 2019-10-12 ENCOUNTER — Telehealth: Payer: Self-pay | Admitting: *Deleted

## 2019-10-12 ENCOUNTER — Other Ambulatory Visit: Payer: Self-pay | Admitting: Women's Health

## 2019-10-12 DIAGNOSIS — I10 Essential (primary) hypertension: Secondary | ICD-10-CM

## 2019-10-12 LAB — HEMOGLOBIN A1C
Hgb A1c MFr Bld: 5.2 % of total Hgb (ref <5.7)
Mean Plasma Glucose: 103 (calc)
eAG (mmol/L): 5.7 (calc)

## 2019-10-12 LAB — VITAMIN B12: Vitamin B-12: 316 pg/mL (ref 200–1100)

## 2019-10-12 MED ORDER — VALACYCLOVIR HCL 500 MG PO TABS: ORAL_TABLET | ORAL | 5 refills | Status: DC

## 2019-10-12 NOTE — Telephone Encounter (Signed)
PA done via cover my meds for Zovirax 5% medication approved PA Case: [id number], Status: Approved, Coverage Starts on: 10/12/2019 12:00:00 AM, Coverage Ends on: 01/10/2020 12:00:00 AM.

## 2019-10-17 ENCOUNTER — Ambulatory Visit: Payer: Self-pay | Attending: Internal Medicine

## 2019-10-17 DIAGNOSIS — Z23 Encounter for immunization: Secondary | ICD-10-CM

## 2020-01-09 ENCOUNTER — Telehealth (INDEPENDENT_AMBULATORY_CARE_PROVIDER_SITE_OTHER): Payer: Self-pay | Admitting: Primary Care

## 2020-01-09 DIAGNOSIS — R112 Nausea with vomiting, unspecified: Secondary | ICD-10-CM | POA: Insufficient documentation

## 2020-01-09 DIAGNOSIS — R197 Diarrhea, unspecified: Secondary | ICD-10-CM

## 2020-01-09 HISTORY — DX: Nausea with vomiting, unspecified: R11.2

## 2020-01-09 HISTORY — DX: Diarrhea, unspecified: R19.7

## 2020-01-09 MED ORDER — ONDANSETRON 4 MG PO TBDP: 4.0000 mg | ORAL_TABLET | Freq: Three times a day (TID) | ORAL | 0 refills | Status: AC | PRN

## 2020-01-09 NOTE — Assessment & Plan Note (Signed)
Acute symptoms of what sounds to be a viral GI illness, no improvement thus far. She is struggling to stay hydrated given nausea/vomiting. Suspect that she should really be improving over the next 24 hours. Suspect headache is secondary to dehydration and vomiting.  Rx for Zofran provided so that she can work on oral hydration. Will hold off of anti-diarrheal unless she continues to experience diarrhea.  If no improvement over the next 24 hours then we will proceed with labs, and perhaps imaging.   She will update tomorrow. Hospital precautions provided.

## 2020-01-09 NOTE — Patient Instructions (Signed)
You can take the ondansetron every 8 hours as needed for nausea and vomiting.   Slowly sip on water and Gatorade as discussed.  Please update me in 24 hours.  It was a pleasure to see you today! Mayra Reel, NP-C

## 2020-01-09 NOTE — Progress Notes (Signed)
Subjective:    Patient ID: Christina [name], female    DOB: Oct 23, 1955, 64 y.o.   MRN: [id number]  HPI  Virtual Visit via Video Note  I connected with Christina [name] on 01/09/20 at 11:40 AM EDT by a video enabled telemedicine application and verified that I am speaking with the correct person using two identifiers.  Location: Patient: [facility] Provider: Office Participants: Myself and patient   I discussed the limitations of evaluation and management by telemedicine and the availability of in person appointments. The patient expressed understanding and agreed to proceed.  We attempted to connect via video, but her connection was poor. We had to conduct our visit via phone which lasted 8 min and 7 seconds.  History of Present Illness:  Christina [name] is a 64 year old female with a history of hypertension, osteoarthritis, IBS, fatigue who presents today with a chief complaint of vomiting and diarrhea.  She also reports nausea, vomiting, fever, headache. Her symptoms began three days ago with "not feeling well", and two days ago her diarrhea and vomiting began. She is experiencing 5-6 episodes of diarrhea daily, 8 episodes of vomiting daily. She is trying to stay hydrated, but will become nauseated and vomit after each time she drinks. She's not eaten anything since Friday evening.   No one else is sick in her household. [place] 10 [place], has been in public eating at restaurants. She's been vaccinated from [place]. She denies cough, rhinorrhea. She's taken imodium for diarrhea and Tylenol/Excedrine Migraine for headaches, all without improvement.    Observations/Objective:  Alert and oriented. Appears well, not sickly. No distress. Speaking in complete sentences.  Assessment and Plan:  Acute symptoms of what sounds to be a viral GI illness, no improvement thus far. She is struggling to stay hydrated given nausea/vomiting. Suspect that she should really be  improving over the next 24 hours. Suspect headache is secondary to dehydration and vomiting.  Rx for Zofran provided so that she can work on oral hydration. Will hold off of anti-diarrheal unless she continues to experience diarrhea.  If no improvement over the next 24 hours then we will proceed with labs, and perhaps imaging.   She will update tomorrow. Hospital precautions provided.   Follow Up Instructions:  You can take the ondansetron every 8 hours as needed for nausea and vomiting.   Slowly sip on water and Gatorade as discussed.  Please update me in 24 hours.  It was a pleasure to see you today! Mayra Reel, NP-C    I discussed the assessment and treatment plan with the patient. The patient was provided an opportunity to ask questions and all were answered. The patient agreed with the plan and demonstrated an understanding of the instructions.   The patient was advised to call back or seek an in-person evaluation if the symptoms worsen or if the condition fails to improve as anticipated.    Doreene Nest, NP    Review of Systems  Constitutional: Positive for fatigue and fever.  HENT: Negative for congestion and rhinorrhea.   Respiratory: Negative for cough.   Gastrointestinal: Positive for diarrhea, nausea and vomiting. Negative for abdominal pain.  Neurological: Positive for headaches.       Past Medical History:  Diagnosis Date  . Depression   . GAD (generalized anxiety disorder)   . Genital herpes   . Osteoarthritis   . Seasonal allergies  Social History   Socioeconomic History  . Marital status: Married    Spouse name: Not on file  . Number of children: Not on file  . Years of education: Not on file  . Highest education level: Not on file  Occupational History  . Not on file  Tobacco Use  . Smoking status: Never Smoker  . Smokeless tobacco: Never Used  Vaping Use  . Vaping Use: Never used  Substance and Sexual Activity  . Alcohol  use: Yes    Alcohol/week: 0.0 standard drinks    Comment: daily   . Drug use: No  . Sexual activity: Yes    Birth control/protection: Surgical    Comment: FEB. 1980, 4 PARTNERS.  Other Topics Concern  . Not on file  Social History Narrative   Married.   2 children.   Works as Arts development officer.    Social Determinants of Health   Financial Resource Strain:   . Difficulty of Paying Living Expenses:   Food Insecurity:   . Worried About Programme researcher, broadcasting/film/video in the Last Year:   . Barista in the Last Year:   Transportation Needs:   . Freight forwarder (Medical):   Marland Kitchen Lack of Transportation (Non-Medical):   Physical Activity:   . Days of Exercise per Week:   . Minutes of Exercise per Session:   Stress:   . Feeling of Stress :   Social Connections:   . Frequency of Communication with Friends and Family:   . Frequency of Social Gatherings with Friends and Family:   . Attends Religious Services:   . Active Member of Clubs or Organizations:   . Attends Banker Meetings:   Marland Kitchen Marital Status:   Intimate Partner Violence:   . Fear of Current or Ex-Partner:   . Emotionally Abused:   Marland Kitchen Physically Abused:   . Sexually Abused:     Past Surgical History:  Procedure Laterality Date  . CARPAL TUNNEL RELEASE Bilateral 05/21/2013   BILATERAL CARPAL TUNNEL SURGERY  . CESAREAN SECTION  1990  . CESAREAN SECTION  1992  . VAGINAL HYSTERECTOMY      Family History  Problem Relation Age of Onset  . Cancer Mother        brain  . Ovarian cancer Mother   . Cancer Father        prostate  . Breast cancer Maternal Grandmother   . Ovarian cancer Maternal Grandmother   . Heart Problems Brother   . Healthy Daughter   . Healthy Daughter   . Healthy Son   . Healthy Son     Allergies  Allergen Reactions  . Toradol [Ketorolac Tromethamine] Nausea And Vomiting  . Ultram [Tramadol] Nausea And Vomiting    Current Outpatient Medications on File Prior to Visit  Medication  Sig Dispense Refill  . acyclovir ointment (ZOVIRAX) 5 % APPLY 1 APPLICATION TOPICALLY EVERY 3 HOURS. 30 g 2  . estradiol (ESTRACE) 0.1 MG/GM vaginal cream Place 1 Applicatorful vaginally at bedtime. 42.5 g 12  . lisinopril (ZESTRIL) 20 MG tablet TAKE 1 TABLET BY MOUTH DAILY FOR BLOOD PRESSURE. 90 tablet 0  . valACYclovir (VALTREX) 500 MG tablet Take one tablet by mouth twice daily for 3-5 day for onset outbreak , then daily as needed. 30 tablet 5   No current facility-administered medications on file prior to visit.    There were no vitals taken for this visit.   Objective:   Physical Exam Pulmonary:  Effort: Pulmonary effort is normal.     Comments: No cough during exam Neurological:     Mental Status: She is alert and oriented to person, place, and time.  Psychiatric:        Mood and Affect: Mood normal.            Assessment & Plan:

## 2020-01-16 ENCOUNTER — Other Ambulatory Visit: Payer: Self-pay

## 2020-01-16 ENCOUNTER — Ambulatory Visit: Payer: 59 | Admitting: Nurse Practitioner

## 2020-01-16 VITALS — BP 124/78

## 2020-01-16 DIAGNOSIS — B009 Herpesviral infection, unspecified: Secondary | ICD-10-CM | POA: Diagnosis not present

## 2020-01-16 MED ORDER — VALACYCLOVIR HCL 500 MG PO TABS: ORAL_TABLET | ORAL | 3 refills | Status: DC

## 2020-01-16 NOTE — Progress Notes (Signed)
   Acute Office Visit  Subjective:    Patient ID: Christina [name], female    DOB: 07/24/1955, 64 y.o.   MRN: [id number]   HPI 64 y.o. presents today for sores on right buttock that appeared 4 days ago. Says they have improved since using antivirals. History of HSV, Valtrex PO as needed and Acyclovir ointment as needed for outbreaks. She has been having episodes monthly and notices outbreaks are more frequent with stress.    Review of Systems  Constitutional: Negative.   Skin:       Sores on right buttock, painful, red       Objective:    Physical Exam Constitutional:      Appearance: Normal appearance.  Skin:    General: Skin is warm and dry.          BP 124/78  Wt Readings from Last 3 Encounters:  10/11/19 158 lb (71.7 kg)  03/26/19 150 lb (68 kg)  03/04/19 140 lb (63.5 kg)        Assessment & Plan:   Problem List Items Addressed This Visit    None    Visit Diagnoses    HSV (herpes simplex virus) infection    -  Primary   Relevant Medications   valACYclovir (VALTREX) 500 MG tablet     Plan: Vesicle-like lesions consistent with HSV. Due to recurrent monthly episodes it is recommended she take suppressive medication Valacyclovir 500 mg daily to prevent outbreaks. Use Acyclovir ointment as needed for outbreaks. Keep area clean and dry, wash hands after applying ointment. She is agreeable to plan.      Olivia Mackie [facility], 2:32 PM 01/16/2020

## 2020-03-20 ENCOUNTER — Ambulatory Visit (INDEPENDENT_AMBULATORY_CARE_PROVIDER_SITE_OTHER): Payer: 59 | Admitting: Internal Medicine

## 2020-03-20 ENCOUNTER — Other Ambulatory Visit: Payer: Self-pay

## 2020-03-20 ENCOUNTER — Encounter: Payer: Self-pay | Admitting: Internal Medicine

## 2020-03-20 DIAGNOSIS — T7840XA Allergy, unspecified, initial encounter: Secondary | ICD-10-CM | POA: Diagnosis not present

## 2020-03-20 MED ORDER — TRIAMCINOLONE ACETONIDE 0.1 % EX CREA: 1.0000 "application " | TOPICAL_CREAM | Freq: Two times a day (BID) | CUTANEOUS | 1 refills | Status: DC | PRN

## 2020-03-20 NOTE — Patient Instructions (Signed)
Please try zyrtec (cetirizine) 10mg  once or even twice a day--and then cream up to 3-4 times a day. Let me know if you aren't better by tomorrow morning--I will send the prescription for the prednisone.

## 2020-03-20 NOTE — Assessment & Plan Note (Addendum)
Seems to be a contact reaction--though she can't remember touching anything Discussed options---will hold off on oral prednisone (she will email tomorrow morning if not improved) Will try cortisone cream Zyrtec up to bid Prednisone only if worsens

## 2020-03-20 NOTE — Progress Notes (Signed)
Subjective:    Patient ID: Christina [name], female    DOB: [birth year], 64 y.o.   MRN: [id number]  HPI Here due to rash This visit occurred during the SARS-CoV-2 public health emergency.  Safety protocols were in place, including screening questions prior to the visit, additional usage of staff PPE, and extensive cleaning of exam room while observing appropriate contact time as indicated for disinfecting solutions.   Scattered rash that has been spreading First area was in hair line on left side of face---started ~ 1 week ago Had been at beach till a few days before the rash came out (at her own home) Has spread to trunk and other parts of body---but not on legs  They have been itchy Using a calamine like cream--some help  Current Outpatient Medications on File Prior to Visit  Medication Sig Dispense Refill   acyclovir ointment (ZOVIRAX) 5 % APPLY 1 APPLICATION TOPICALLY EVERY 3 HOURS. 30 g 2   esomeprazole (NEXIUM) 40 MG capsule Take 40 mg by mouth daily.     estradiol (ESTRACE) 0.1 MG/GM vaginal cream Place 1 Applicatorful vaginally at bedtime. 42.5 g 12   lisinopril (ZESTRIL) 20 MG tablet TAKE 1 TABLET BY MOUTH DAILY FOR BLOOD PRESSURE. 90 tablet 0   ondansetron (ZOFRAN ODT) 4 MG disintegrating tablet Take 1 tablet (4 mg total) by mouth every 8 (eight) hours as needed for nausea or vomiting. 15 tablet 0   valACYclovir (VALTREX) 500 MG tablet Take one tablet by mouth twice daily for 3-5 day for onset outbreak , then daily as needed. 90 tablet 3   No current facility-administered medications on file prior to visit.    Allergies  Allergen Reactions   Toradol [Ketorolac Tromethamine] Nausea And Vomiting   Ultram [Tramadol] Nausea And Vomiting    Past Medical History:  Diagnosis Date   Depression    GAD (generalized anxiety disorder)    Genital herpes    Osteoarthritis    Seasonal allergies     Past Surgical History:  Procedure Laterality Date   CARPAL TUNNEL  RELEASE Bilateral 05/21/2013   BILATERAL CARPAL TUNNEL SURGERY   CESAREAN SECTION  1990   CESAREAN SECTION  1992   VAGINAL HYSTERECTOMY      Family History  Problem Relation Age of Onset   Cancer Mother        brain   Ovarian cancer Mother    Cancer Father        prostate   Breast cancer Maternal Grandmother    Ovarian cancer Maternal Grandmother    Heart Problems Brother    Healthy Daughter    Healthy Daughter    Healthy Son    Healthy Son     Social History   Socioeconomic History   Marital status: Married    Spouse name: Not on file   Number of children: Not on file   Years of education: Not on file   Highest education level: Not on file  Occupational History   Not on file  Tobacco Use   Smoking status: Never Smoker   Smokeless tobacco: Never Used  Vaping Use   Vaping Use: Never used  Substance and Sexual Activity   Alcohol use: Yes    Alcohol/week: 0.0 standard drinks    Comment: daily    Drug use: No   Sexual activity: Yes    Birth control/protection: Surgical    Comment: FEB. 1980, 4 PARTNERS.  Other Topics Concern   Not on file  Social  History Narrative   Married.   2 children.   Works as Arts development officer.    Social Determinants of Health   Financial Resource Strain:    Difficulty of Paying Living Expenses: Not on file  Food Insecurity:    Worried About Programme researcher, broadcasting/film/video in the Last Year: Not on file   [employer] of Food in the Last Year: Not on file  Transportation Needs:    Lack of Transportation (Medical): Not on file   Lack of Transportation (Non-Medical): Not on file  Physical Activity:    Days of Exercise per Week: Not on file   Minutes of Exercise per Session: Not on file  Stress:    Feeling of Stress : Not on file  Social Connections:    Frequency of Communication with Friends and Family: Not on file   Frequency of Social Gatherings with Friends and Family: Not on file   Attends Religious Services: Not  on file   Active Member of Clubs or Organizations: Not on file   Attends Banker Meetings: Not on file   Marital Status: Not on file  Intimate Partner Violence:    Fear of Current or Ex-Partner: Not on file   Emotionally Abused: Not on file   Physically Abused: Not on file   Sexually Abused: Not on file   Review of Systems  Itching will awaken her from sleep at times No fever No illness recently     Objective:   Physical Exam Constitutional:      Appearance: Normal appearance.  Skin:    Comments: Mostly macular red rash---along hairline on left face, left neck, under breasts, left low back  Neurological:     Mental Status: She is alert.            Assessment & Plan:

## 2020-03-21 DIAGNOSIS — T7840XA Allergy, unspecified, initial encounter: Secondary | ICD-10-CM

## 2020-03-22 MED ORDER — PREDNISONE 20 MG PO TABS: ORAL_TABLET | ORAL | 0 refills | Status: DC

## 2020-04-09 ENCOUNTER — Other Ambulatory Visit: Payer: Self-pay | Admitting: Physician Assistant

## 2020-04-09 DIAGNOSIS — K219 Gastro-esophageal reflux disease without esophagitis: Secondary | ICD-10-CM

## 2020-05-03 ENCOUNTER — Ambulatory Visit (HOSPITAL_COMMUNITY)
Admission: RE | Admit: 2020-05-03 | Discharge: 2020-05-03 | Disposition: A | Discharge status: Home / Self Care | Payer: 59 | Source: Ambulatory Visit | Attending: Physician Assistant | Admitting: Physician Assistant

## 2020-05-03 ENCOUNTER — Other Ambulatory Visit: Payer: Self-pay

## 2020-05-03 DIAGNOSIS — K219 Gastro-esophageal reflux disease without esophagitis: Secondary | ICD-10-CM | POA: Diagnosis present

## 2020-05-03 MED ORDER — TECHNETIUM TC 99M MEBROFENIN IV KIT
5.5000 | PACK | Freq: Once | INTRAVENOUS | Status: AC | PRN
  Administered 2020-05-03: 5.5 via INTRAVENOUS

## 2020-07-04 ENCOUNTER — Encounter: Payer: Self-pay | Admitting: Primary Care

## 2020-07-04 ENCOUNTER — Other Ambulatory Visit: Payer: Self-pay

## 2020-07-04 ENCOUNTER — Ambulatory Visit: Payer: 59 | Admitting: Primary Care

## 2020-07-04 VITALS — BP 134/80 | HR 72 | Temp 97.8°F | Ht 64.0 in | Wt 167.0 lb

## 2020-07-04 DIAGNOSIS — R682 Dry mouth, unspecified: Secondary | ICD-10-CM

## 2020-07-04 DIAGNOSIS — R7989 Other specified abnormal findings of blood chemistry: Secondary | ICD-10-CM

## 2020-07-04 DIAGNOSIS — F32A Depression, unspecified: Secondary | ICD-10-CM

## 2020-07-04 DIAGNOSIS — I10 Essential (primary) hypertension: Secondary | ICD-10-CM | POA: Diagnosis not present

## 2020-07-04 DIAGNOSIS — F419 Anxiety disorder, unspecified: Secondary | ICD-10-CM

## 2020-07-04 DIAGNOSIS — E538 Deficiency of other specified B group vitamins: Secondary | ICD-10-CM

## 2020-07-04 DIAGNOSIS — K589 Irritable bowel syndrome without diarrhea: Secondary | ICD-10-CM

## 2020-07-04 DIAGNOSIS — B009 Herpesviral infection, unspecified: Secondary | ICD-10-CM

## 2020-07-04 HISTORY — DX: Dry mouth, unspecified: R68.2

## 2020-07-04 LAB — COMPREHENSIVE METABOLIC PANEL
ALT: 15 U/L (ref 0–35)
AST: 16 U/L (ref 0–37)
Albumin: 4.7 g/dL (ref 3.5–5.2)
Alkaline Phosphatase: 73 U/L (ref 39–117)
BUN: 16 mg/dL (ref 6–23)
CO2: 30 mEq/L (ref 19–32)
Calcium: 9.9 mg/dL (ref 8.4–10.5)
Chloride: 101 mEq/L (ref 96–112)
Creatinine, Ser: 0.86 mg/dL (ref 0.40–1.20)
GFR: 72.03 mL/min (ref >60.00)
Glucose, Bld: 119 mg/dL — ABNORMAL HIGH (ref 70–99)
Potassium: 5.5 mEq/L — ABNORMAL HIGH (ref 3.5–5.1)
Sodium: 137 mEq/L (ref 135–145)
Total Bilirubin: 0.5 mg/dL (ref 0.2–1.2)
Total Protein: 7.4 g/dL (ref 6.0–8.3)

## 2020-07-04 LAB — CBC
HCT: 43.2 % (ref 36.0–46.0)
Hemoglobin: 14.7 g/dL (ref 12.0–15.0)
MCHC: 34 g/dL (ref 30.0–36.0)
MCV: 95.7 fl (ref 78.0–100.0)
Platelets: 298 10*3/uL (ref 150.0–400.0)
RBC: 4.51 Mil/uL (ref 3.87–5.11)
RDW: 13 % (ref 11.5–15.5)
WBC: 7.1 10*3/uL (ref 4.0–10.5)

## 2020-07-04 LAB — TSH: TSH: 1.42 u[IU]/mL (ref 0.35–4.50)

## 2020-07-04 LAB — VITAMIN B12: Vitamin B-12: 148 pg/mL — ABNORMAL LOW (ref 211–911)

## 2020-07-04 MED ORDER — DULOXETINE HCL 30 MG PO CPEP: 30.0000 mg | ORAL_CAPSULE | Freq: Every day | ORAL | 1 refills | Status: DC

## 2020-07-04 NOTE — Assessment & Plan Note (Signed)
Acute for the last 6 weeks, no obvious cause as nothing is changed.  We will check some labs today. Exam today with dryness, but does have some moisture to the tongue.  No obvious blocked Stensen's ducts or salivary glands noted on exam.  She will also mention symptoms to her GI physician.

## 2020-07-04 NOTE — Assessment & Plan Note (Signed)
Repeat TSH pending

## 2020-07-04 NOTE — Patient Instructions (Signed)
Start duloxetine (Cymbalta) 30 mg daily for anxiety and depression.  Stop by the lab prior to leaving today. I will notify you of your results once received.   Call your GI doctor and/or your dentist regarding your dry mouth.  Please schedule a follow up visit for 6 weeks for follow up of anxiety/depression.  It was a pleasure to see you today!

## 2020-07-04 NOTE — Assessment & Plan Note (Signed)
Following with GYN who prescribes daily valacyclovir for suppression.  Continue valacyclovir 500 mg daily.

## 2020-07-04 NOTE — Assessment & Plan Note (Signed)
Repeat vitamin B12 level pending. 

## 2020-07-04 NOTE — Assessment & Plan Note (Signed)
Deteriorated.  She has been on numerous medications for anxiety and depression in both classes of SSRIs and SNRIs.    GAD-7 and PHQ-9 scores of 21 today.  Discussed options for treatment, she would like to resume medication.  Prescription for duloxetine 30 mg sent to pharmacy as she was on this last year.   We discussed possible side effects of headache, GI upset, drowsiness, and SI/HI. If thoughts of SI/HI develop, we discussed to present to the emergency immediately. Patient verbalized understanding.   Follow up in 6 weeks for re-evaluation.

## 2020-07-04 NOTE — Assessment & Plan Note (Signed)
Borderline today, but she is upset with symptoms of anxiety and depression.  Continue lisinopril 20 mg for now, CMP pending.

## 2020-07-04 NOTE — Assessment & Plan Note (Signed)
Chronic, stable, no concerns today.  Continue Nexium 40 mg, Zofran 4 mg as needed.

## 2020-07-04 NOTE — Progress Notes (Signed)
Subjective:    Patient ID: Christina [name], female    DOB: Aug 04, 1955, 65 y.o.   MRN: [id number]  HPI  This visit occurred during the SARS-CoV-2 public health emergency.  Safety protocols were in place, including screening questions prior to the visit, additional usage of staff PPE, and extensive cleaning of exam room while observing appropriate contact time as indicated for disinfecting solutions.   Christina [name] is a 65 year old female with a history of hypertension, abnormal TSH, anxiety and depression, fatigue who presents today to discuss anxiety and depression, dry mouth, and for general follow up.   1) Anxiety and Depression: Previously managed on duloxetine at 60 mg in 2020, previously has been on fluoxetine 10 mg in 2019, sertraline 25 mg, 50 mg in 2017, 2019, 2020, venlafaxine ER 75 mg in 2015.  Today she endorses that her depression and anxiety are at an "all time high" and she cannot manage. Symptoms include difficulty sleeping, mind racing thoughts, tearfulness, "can't function", worry. Symptoms began years ago, worse within the last several months.   She can't remember if any of her prior medications were effective. She was last on duloxetine (Cymbalta) doesn't recall an issue.  2) Essential Hypertension: Currently managed on lisinopril 20 mg. She denies dizziness, chest pain.   3) GERD: Currently following with GI through [facility], doing well on Nexium 40 mg, Zofran, and some other medication for IBS. Doing well on this regimen overall.  4) Genital Herpes: Chronic, managed on daily Valtrex 500 mg daily. Prescribed by her GYN, will take this Valtrex daily for one year which will end in July 2022.  5) Dry Mouth: Acute for the last 6 weeks, doesn't seem to produce saliva with and without eating. Eating is very difficult.  She's been using Biotene toothpaste, sprays, etc without improvement.   She has increased her water intake since symptoms began without improvement. She denies changes in  diet, medications, regular consumption of antihistamines, enlarged cervical glands, oral pain, oral swelling. She has yet to notify her dentist or GI specialist.   BP Readings from Last 3 Encounters:  07/04/20 134/80  03/20/20 126/86  01/16/20 124/78     PHQ9 SCORE ONLY 07/04/2020 07/26/2018 06/14/2018  PHQ-9 Total Score 21 17 20    GAD 7 : Generalized Anxiety Score 07/04/2020 07/26/2018 06/14/2018  Nervous, Anxious, on Edge 3 1 3   Control/stop worrying 3 3 3   Worry too much - different things 3 3 3   Trouble relaxing 3 3 0  Restless 3 2 0  Easily annoyed or irritable 3 1 3   Afraid - awful might happen 3 2 3   Total GAD 7 Score 21 15 15   Anxiety Difficulty Extremely difficult Somewhat difficult Somewhat difficult      Review of Systems  HENT:       Dry mouth, see HPi  Eyes: Negative for visual disturbance.  Respiratory: Negative for shortness of breath.   Cardiovascular: Negative for chest pain.  Neurological: Positive for headaches. Negative for dizziness.  Psychiatric/Behavioral: The patient is nervous/anxious.        See HPI       Past Medical History:  Diagnosis Date  . Depression   . GAD (generalized anxiety disorder)   . Genital herpes   . Osteoarthritis   . Seasonal allergies      Social History   Socioeconomic History  . Marital status: Married    Spouse name: Not on file  . Number of children: Not on file  .  Years of education: Not on file  . Highest education level: Not on file  Occupational History  . Not on file  Tobacco Use  . Smoking status: Never Smoker  . Smokeless tobacco: Never Used  Vaping Use  . Vaping Use: Never used  Substance and Sexual Activity  . Alcohol use: Yes    Alcohol/week: 0.0 standard drinks    Comment: daily   . Drug use: No  . Sexual activity: Yes    Birth control/protection: Surgical    Comment: FEB. 1980, 4 PARTNERS.  Other Topics Concern  . Not on file  Social History Narrative   Married.   2 children.   Works as  Materials engineer.    Social Determinants of Health   Financial Resource Strain: Not on file  Food Insecurity: Not on file  Transportation Needs: Not on file  Physical Activity: Not on file  Stress: Not on file  Social Connections: Not on file  Intimate Partner Violence: Not on file    Past Surgical History:  Procedure Laterality Date  . CARPAL TUNNEL RELEASE Bilateral 05/21/2013   BILATERAL CARPAL TUNNEL SURGERY  . CESAREAN SECTION  1990  . CESAREAN SECTION  1992  . VAGINAL HYSTERECTOMY      Family History  Problem Relation Age of Onset  . Cancer Mother        brain  . Ovarian cancer Mother   . Cancer Father        prostate  . Breast cancer Maternal Grandmother   . Ovarian cancer Maternal Grandmother   . Heart Problems Brother   . Healthy Daughter   . Healthy Daughter   . Healthy Son   . Healthy Son     Allergies  Allergen Reactions  . Toradol [Ketorolac Tromethamine] Nausea And Vomiting  . Ultram [Tramadol] Nausea And Vomiting    Current Outpatient Medications on File Prior to Visit  Medication Sig Dispense Refill  . acyclovir ointment (ZOVIRAX) 5 % APPLY 1 APPLICATION TOPICALLY EVERY 3 HOURS. 30 g 2  . esomeprazole (NEXIUM) 40 MG capsule Take 40 mg by mouth daily.    Marland Kitchen estradiol (ESTRACE) 0.1 MG/GM vaginal cream Place 1 Applicatorful vaginally at bedtime. 42.5 g 12  . lisinopril (ZESTRIL) 20 MG tablet TAKE 1 TABLET BY MOUTH DAILY FOR BLOOD PRESSURE. 90 tablet 0  . ondansetron (ZOFRAN ODT) 4 MG disintegrating tablet Take 1 tablet (4 mg total) by mouth every 8 (eight) hours as needed for nausea or vomiting. 15 tablet 0  . valACYclovir (VALTREX) 500 MG tablet Take one tablet by mouth twice daily for 3-5 day for onset outbreak , then daily as needed. 90 tablet 3   No current facility-administered medications on file prior to visit.    BP 134/80   Pulse 72   Temp 97.8 F (36.6 C) (Temporal)   Ht 5\' 4"  (1.626 m)   Wt 167 lb (75.8 kg)   SpO2 98%   BMI 28.67 kg/m     Objective:   Physical Exam Constitutional:      Appearance: She is well-nourished.  HENT:     Mouth/Throat:     Mouth: Mucous membranes are dry.     Pharynx: No posterior oropharyngeal erythema.     Comments: Overall dry appearing, moisture noted to tongue. Cardiovascular:     Rate and Rhythm: Normal rate and regular rhythm.  Pulmonary:     Effort: Pulmonary effort is normal.     Breath sounds: Normal breath sounds.  Musculoskeletal:  Cervical back: Neck supple.  Skin:    General: Skin is warm and dry.  Neurological:     Mental Status: She is alert.  Psychiatric:        Mood and Affect: Mood and affect and mood normal.            Assessment & Plan:

## 2020-07-05 ENCOUNTER — Other Ambulatory Visit (INDEPENDENT_AMBULATORY_CARE_PROVIDER_SITE_OTHER): Payer: 59

## 2020-07-05 DIAGNOSIS — R739 Hyperglycemia, unspecified: Secondary | ICD-10-CM | POA: Diagnosis not present

## 2020-07-05 LAB — HEMOGLOBIN A1C: Hgb A1c MFr Bld: 5.4 % (ref 4.6–6.5)

## 2020-07-06 NOTE — Telephone Encounter (Signed)
FYI

## 2020-07-12 NOTE — Telephone Encounter (Signed)
She will need 10 days post symptom onset to come into our clinic, is this right?

## 2020-07-30 ENCOUNTER — Other Ambulatory Visit: Payer: Self-pay | Admitting: Primary Care

## 2020-07-30 DIAGNOSIS — F32A Depression, unspecified: Secondary | ICD-10-CM

## 2020-07-30 DIAGNOSIS — F419 Anxiety disorder, unspecified: Secondary | ICD-10-CM

## 2020-08-12 IMAGING — CT CT CHEST W/ CM
1 of 5 series · 2 of 36 positions shown, 3 images · IV contrast (agent unspecified)
Comparison: CT dated July 15, 2016.
COMPARISON: CT dated July 15, 2016.
COMPARISON: CT dated July 15, 2016.
COMPARISON: CT dated July 15, 2016.

Addendum:
CLINICAL DATA: Acute pain due to trauma

EXAM:
CT HEAD WITHOUT CONTRAST
CT CERVICAL SPINE WITHOUT CONTRAST
CT CHEST, ABDOMEN AND PELVIS WITH CONTRAST
TECHNIQUE: Contiguous axial images were obtained from the base of the skull
through the vertex without intravenous contrast.

[Series 2: cap with · axial · 0.72mm/px · z∈[-478,-198]mm · 2 of 126 slices shown, 3 images]
[im 35/126  mediastinal]
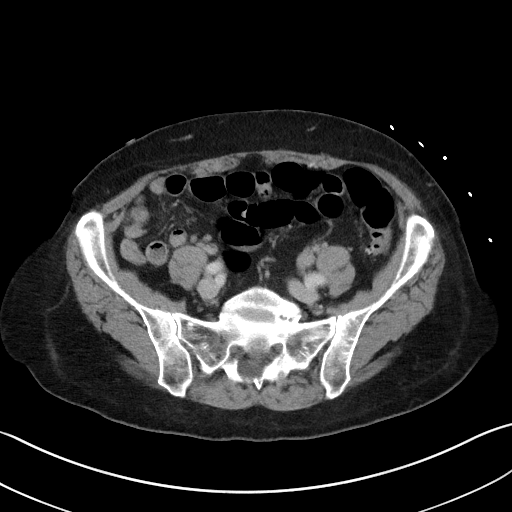
[im 35/126  lung]
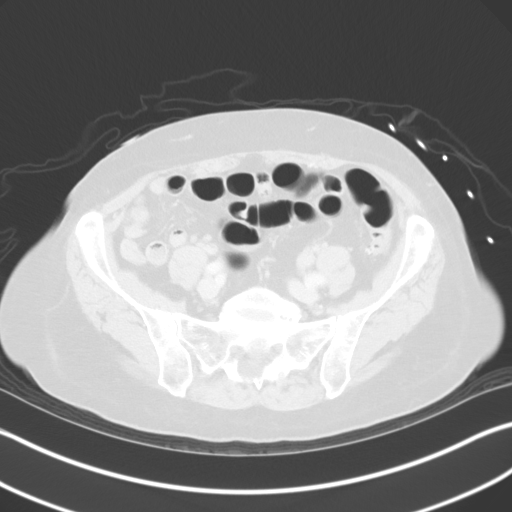
[im 91/126  lung]
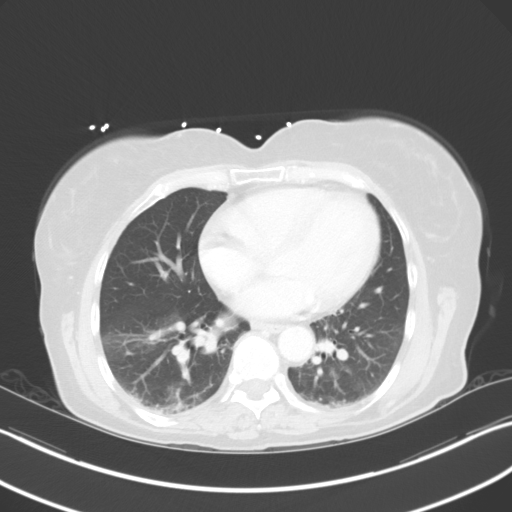

[2 of 36 positions shown; findings below may reference images not displayed]

Multidetector CT imaging of the cervical spine was performed without
intravenous contrast. Multiplanar CT image reconstructions were also
generated.

Multidetector CT imaging of the chest, abdomen and pelvis was
performed following the standard protocol during bolus
administration of intravenous contrast.

CONTRAST:  100mL OMNIPAQUE IOHEXOL 300 MG/ML  SOLN
FINDINGS: CT HEAD FINDINGS

Brain: No evidence of acute infarction, hemorrhage, hydrocephalus,
extra-axial collection or mass lesion/mass effect.

Vascular: No hyperdense vessel or unexpected calcification.

Skull: Normal. Negative for fracture or focal lesion.

Sinuses/Orbits: No acute finding.

Other: None.

CT CERVICAL FINDINGS

Alignment: See below.

Skull base and vertebrae: There is a mildly displaced fracture
through the anterior arch of C1. There is a significantly displaced
type 3 dens fracture. The right lateral aspect of the C1 vertebral
body appears to be perched on the facet of C2. There is likely
disruption of the ligamentum flavum at the upper cervical spine.

Soft tissues and spinal canal: There is minimal prevertebral soft
tissue swelling. There is no convincing CT evidence for a large
epidural hematoma.

Disc levels: There is multilevel disc height loss throughout the
cervical spine, greatest at the C5-C6 and C6-C7 levels.

Other:  None.

CT CHEST FINDINGS

Cardiovascular: There is no large centrally located pulmonary
embolus. There is no obvious dissection. Coronary artery
calcifications are noted.

Mediastinum/Nodes:

--No mediastinal or hilar lymphadenopathy.

--No axillary lymphadenopathy.

--No supraclavicular lymphadenopathy.

--Normal thyroid gland.

--The esophagus is unremarkable

Lungs/Pleura: There is atelectasis at the lung bases. There is no
pneumothorax. No significant pleural effusion. The trachea is
unremarkable.

Musculoskeletal: There is a displaced fracture of the sternal body
without evidence for significant retrosternal hematoma. There is a
0.8 cm right-sided breast nodule.

CT ABDOMEN PELVIS FINDINGS

Hepatobiliary: There are few hypodensities in the liver that are too
small to characterize but statistically are most likely to represent
benign cysts. Normal gallbladder.There is intrahepatic and
extrahepatic biliary ductal dilatation of unknown clinical
significance.

Pancreas: Normal contours without ductal dilatation. No
peripancreatic fluid collection.

Spleen: No splenic laceration or hematoma.

Adrenals/Urinary Tract:

--Adrenal glands: No adrenal hemorrhage.

--Right kidney/ureter: There is a punctate nonobstructing stone in
the interpolar region of the right kidney.

--Left kidney/ureter: There is a nonobstructing stone in the upper
pole the left kidney. There are multiple left-sided cortical
hypodensities are too small to characterize but are statistically
most likely to represent benign cysts.

--Urinary bladder: Unremarkable.

Stomach/Bowel:

--Stomach/Duodenum: No hiatal hernia or other gastric abnormality.
Normal duodenal course and caliber.

--Small bowel: No dilatation or inflammation.

--Colon: There is a large amount of stool in the colon. There are
scattered diverticula without CT evidence for diverticulitis.

--Appendix: Normal.

Vascular/Lymphatic: Atherosclerotic calcification is present within
the non-aneurysmal abdominal aorta, without hemodynamically
significant stenosis.

--No retroperitoneal lymphadenopathy.

--No mesenteric lymphadenopathy.

--No pelvic or inguinal lymphadenopathy.

Reproductive: Status post hysterectomy. No adnexal mass.

Other: No ascites or free air. The abdominal wall is normal.

Musculoskeletal. No acute displaced fractures.
IMPRESSION: 1. No acute intracranial process.
2. Mildly displaced fracture through the anterior arch of C1. The
right lateral aspect of the C1 vertebral body appears to be perched
on the facet of C2.
3. Acute displaced type 3 dens fracture. There is likely a fracture
through the left C2 pedicle with disruption of the transverse
foramen on the left. A vertebral artery injury is not excluded on
this exam and should be correlated with CTA of the neck.
4. There is probable disruption of the posterior atlanto axial
membrane at the C1-C2 level.
5. Displaced sternal body fracture without evidence for significant
retrosternal hematoma.
6. No acute intra-abdominal or pelvic injury.
7. Intra and extrahepatic biliary ductal dilatation of unknown
clinical significance. Correlate with LFTs. If there is concern for
biliary obstruction, consider further evaluation with MRCP.
8. Large amount of stool in the colon.
9. Bilateral nonobstructive nephrolithiasis.
10. Colonic diverticulosis without diverticulitis.

Aortic Atherosclerosis (YEJ1K-FRS.S).

These results were called by telephone at the time of interpretation
on 03/04/2019 at [DATE] to Dr. Bobo , who verbally acknowledged
these results.

ADDENDUM:
There is a 0.8 cm right-sided breast nodule. Follow-up with
outpatient mammography is recommended for further evaluation of this
finding.

ADDENDUM:
There is a 0.8 cm right-sided breast nodule. Follow-up with
outpatient mammography is recommended for further evaluation of this
finding.

ADDENDUM:
There is a 0.8 cm right-sided breast nodule. Follow-up with
outpatient mammography is recommended for further evaluation of this
finding.

ADDENDUM:
There is a 0.8 cm right-sided breast nodule. Follow-up with
outpatient mammography is recommended for further evaluation of this
finding.

*** End of Addendum ***
Multidetector CT imaging of the cervical spine was performed without
intravenous contrast. Multiplanar CT image reconstructions were also
generated.

Multidetector CT imaging of the chest, abdomen and pelvis was
performed following the standard protocol during bolus
administration of intravenous contrast.

CONTRAST:  100mL OMNIPAQUE IOHEXOL 300 MG/ML  SOLN
FINDINGS: CT HEAD FINDINGS

Brain: No evidence of acute infarction, hemorrhage, hydrocephalus,
extra-axial collection or mass lesion/mass effect.

Vascular: No hyperdense vessel or unexpected calcification.

Skull: Normal. Negative for fracture or focal lesion.

Sinuses/Orbits: No acute finding.

Other: None.

CT CERVICAL FINDINGS

Alignment: See below.

Skull base and vertebrae: There is a mildly displaced fracture
through the anterior arch of C1. There is a significantly displaced
type 3 dens fracture. The right lateral aspect of the C1 vertebral
body appears to be perched on the facet of C2. There is likely
disruption of the ligamentum flavum at the upper cervical spine.

Soft tissues and spinal canal: There is minimal prevertebral soft
tissue swelling. There is no convincing CT evidence for a large
epidural hematoma.

Disc levels: There is multilevel disc height loss throughout the
cervical spine, greatest at the C5-C6 and C6-C7 levels.

Other:  None.

CT CHEST FINDINGS

Cardiovascular: There is no large centrally located pulmonary
embolus. There is no obvious dissection. Coronary artery
calcifications are noted.

Mediastinum/Nodes:

--No mediastinal or hilar lymphadenopathy.

--No axillary lymphadenopathy.

--No supraclavicular lymphadenopathy.

--Normal thyroid gland.

--The esophagus is unremarkable

Lungs/Pleura: There is atelectasis at the lung bases. There is no
pneumothorax. No significant pleural effusion. The trachea is
unremarkable.

Musculoskeletal: There is a displaced fracture of the sternal body
without evidence for significant retrosternal hematoma. There is a
0.8 cm right-sided breast nodule.

CT ABDOMEN PELVIS FINDINGS

Hepatobiliary: There are few hypodensities in the liver that are too
small to characterize but statistically are most likely to represent
benign cysts. Normal gallbladder.There is intrahepatic and
extrahepatic biliary ductal dilatation of unknown clinical
significance.

Pancreas: Normal contours without ductal dilatation. No
peripancreatic fluid collection.

Spleen: No splenic laceration or hematoma.

Adrenals/Urinary Tract:

--Adrenal glands: No adrenal hemorrhage.

--Right kidney/ureter: There is a punctate nonobstructing stone in
the interpolar region of the right kidney.

--Left kidney/ureter: There is a nonobstructing stone in the upper
pole the left kidney. There are multiple left-sided cortical
hypodensities are too small to characterize but are statistically
most likely to represent benign cysts.

--Urinary bladder: Unremarkable.

Stomach/Bowel:

--Stomach/Duodenum: No hiatal hernia or other gastric abnormality.
Normal duodenal course and caliber.

--Small bowel: No dilatation or inflammation.

--Colon: There is a large amount of stool in the colon. There are
scattered diverticula without CT evidence for diverticulitis.

--Appendix: Normal.

Vascular/Lymphatic: Atherosclerotic calcification is present within
the non-aneurysmal abdominal aorta, without hemodynamically
significant stenosis.

--No retroperitoneal lymphadenopathy.

--No mesenteric lymphadenopathy.

--No pelvic or inguinal lymphadenopathy.

Reproductive: Status post hysterectomy. No adnexal mass.

Other: No ascites or free air. The abdominal wall is normal.

Musculoskeletal. No acute displaced fractures.
IMPRESSION: 1. No acute intracranial process.
2. Mildly displaced fracture through the anterior arch of C1. The
right lateral aspect of the C1 vertebral body appears to be perched
on the facet of C2.
3. Acute displaced type 3 dens fracture. There is likely a fracture
through the left C2 pedicle with disruption of the transverse
foramen on the left. A vertebral artery injury is not excluded on
this exam and should be correlated with CTA of the neck.
4. There is probable disruption of the posterior atlanto axial
membrane at the C1-C2 level.
5. Displaced sternal body fracture without evidence for significant
retrosternal hematoma.
6. No acute intra-abdominal or pelvic injury.
7. Intra and extrahepatic biliary ductal dilatation of unknown
clinical significance. Correlate with LFTs. If there is concern for
biliary obstruction, consider further evaluation with MRCP.
8. Large amount of stool in the colon.
9. Bilateral nonobstructive nephrolithiasis.
10. Colonic diverticulosis without diverticulitis.

Aortic Atherosclerosis ([id number].S).

These results were called by telephone at the time of interpretation
on 03/04/2019 at [DATE] to Dr. Bobo , who verbally acknowledged
these results.

Addendum:
Multidetector CT imaging of the cervical spine was performed without
intravenous contrast. Multiplanar CT image reconstructions were also
generated.

Multidetector CT imaging of the chest, abdomen and pelvis was
performed following the standard protocol during bolus
administration of intravenous contrast.

CONTRAST:  100mL OMNIPAQUE IOHEXOL 300 MG/ML  SOLN
FINDINGS: CT HEAD FINDINGS

Brain: No evidence of acute infarction, hemorrhage, hydrocephalus,
extra-axial collection or mass lesion/mass effect.

Vascular: No hyperdense vessel or unexpected calcification.

Skull: Normal. Negative for fracture or focal lesion.

Sinuses/Orbits: No acute finding.

Other: None.

CT CERVICAL FINDINGS

Alignment: See below.

Skull base and vertebrae: There is a mildly displaced fracture
through the anterior arch of C1. There is a significantly displaced
type 3 dens fracture. The right lateral aspect of the C1 vertebral
body appears to be perched on the facet of C2. There is likely
disruption of the ligamentum flavum at the upper cervical spine.

Soft tissues and spinal canal: There is minimal prevertebral soft
tissue swelling. There is no convincing CT evidence for a large
epidural hematoma.

Disc levels: There is multilevel disc height loss throughout the
cervical spine, greatest at the C5-C6 and C6-C7 levels.

Other:  None.

CT CHEST FINDINGS

Cardiovascular: There is no large centrally located pulmonary
embolus. There is no obvious dissection. Coronary artery
calcifications are noted.

Mediastinum/Nodes:

--No mediastinal or hilar lymphadenopathy.

--No axillary lymphadenopathy.

--No supraclavicular lymphadenopathy.

--Normal thyroid gland.

--The esophagus is unremarkable

Lungs/Pleura: There is atelectasis at the lung bases. There is no
pneumothorax. No significant pleural effusion. The trachea is
unremarkable.

Musculoskeletal: There is a displaced fracture of the sternal body
without evidence for significant retrosternal hematoma. There is a
0.8 cm right-sided breast nodule.

CT ABDOMEN PELVIS FINDINGS

Hepatobiliary: There are few hypodensities in the liver that are too
small to characterize but statistically are most likely to represent
benign cysts. Normal gallbladder.There is intrahepatic and
extrahepatic biliary ductal dilatation of unknown clinical
significance.

Pancreas: Normal contours without ductal dilatation. No
peripancreatic fluid collection.

Spleen: No splenic laceration or hematoma.

Adrenals/Urinary Tract:

--Adrenal glands: No adrenal hemorrhage.

--Right kidney/ureter: There is a punctate nonobstructing stone in
the interpolar region of the right kidney.

--Left kidney/ureter: There is a nonobstructing stone in the upper
pole the left kidney. There are multiple left-sided cortical
hypodensities are too small to characterize but are statistically
most likely to represent benign cysts.

--Urinary bladder: Unremarkable.

Stomach/Bowel:

--Stomach/Duodenum: No hiatal hernia or other gastric abnormality.
Normal duodenal course and caliber.

--Small bowel: No dilatation or inflammation.

--Colon: There is a large amount of stool in the colon. There are
scattered diverticula without CT evidence for diverticulitis.

--Appendix: Normal.

Vascular/Lymphatic: Atherosclerotic calcification is present within
the non-aneurysmal abdominal aorta, without hemodynamically
significant stenosis.

--No retroperitoneal lymphadenopathy.

--No mesenteric lymphadenopathy.

--No pelvic or inguinal lymphadenopathy.

Reproductive: Status post hysterectomy. No adnexal mass.

Other: No ascites or free air. The abdominal wall is normal.

Musculoskeletal. No acute displaced fractures.
IMPRESSION: 1. No acute intracranial process.
2. Mildly displaced fracture through the anterior arch of C1. The
right lateral aspect of the C1 vertebral body appears to be perched
on the facet of C2.
3. Acute displaced type 3 dens fracture. There is likely a fracture
through the left C2 pedicle with disruption of the transverse
foramen on the left. A vertebral artery injury is not excluded on
this exam and should be correlated with CTA of the neck.
4. There is probable disruption of the posterior atlanto axial
membrane at the C1-C2 level.
5. Displaced sternal body fracture without evidence for significant
retrosternal hematoma.
6. No acute intra-abdominal or pelvic injury.
7. Intra and extrahepatic biliary ductal dilatation of unknown
clinical significance. Correlate with LFTs. If there is concern for
biliary obstruction, consider further evaluation with MRCP.
8. Large amount of stool in the colon.
9. Bilateral nonobstructive nephrolithiasis.
10. Colonic diverticulosis without diverticulitis.

Aortic Atherosclerosis (RZXDY-[id number].W).

These results were called by telephone at the time of interpretation
on 03/04/2019 at [DATE] to Dr. Yoel , who verbally acknowledged
these results.

ADDENDUM:
There is a 0.8 cm right-sided breast nodule. Follow-up with
outpatient mammography is recommended for further evaluation of this
finding.

*** End of Addendum ***
Multidetector CT imaging of the cervical spine was performed without
intravenous contrast. Multiplanar CT image reconstructions were also
generated.

Multidetector CT imaging of the chest, abdomen and pelvis was
performed following the standard protocol during bolus
administration of intravenous contrast.

CONTRAST:  100mL OMNIPAQUE IOHEXOL 300 MG/ML  SOLN
FINDINGS: CT HEAD FINDINGS

Brain: No evidence of acute infarction, hemorrhage, hydrocephalus,
extra-axial collection or mass lesion/mass effect.

Vascular: No hyperdense vessel or unexpected calcification.

Skull: Normal. Negative for fracture or focal lesion.

Sinuses/Orbits: No acute finding.

Other: None.

CT CERVICAL FINDINGS

Alignment: See below.

Skull base and vertebrae: There is a mildly displaced fracture
through the anterior arch of C1. There is a significantly displaced
type 3 dens fracture. The right lateral aspect of the C1 vertebral
body appears to be perched on the facet of C2. There is likely
disruption of the ligamentum flavum at the upper cervical spine.

Soft tissues and spinal canal: There is minimal prevertebral soft
tissue swelling. There is no convincing CT evidence for a large
epidural hematoma.

Disc levels: There is multilevel disc height loss throughout the
cervical spine, greatest at the C5-C6 and C6-C7 levels.

Other:  None.

CT CHEST FINDINGS

Cardiovascular: There is no large centrally located pulmonary
embolus. There is no obvious dissection. Coronary artery
calcifications are noted.

Mediastinum/Nodes:

--No mediastinal or hilar lymphadenopathy.

--No axillary lymphadenopathy.

--No supraclavicular lymphadenopathy.

--Normal thyroid gland.

--The esophagus is unremarkable

Lungs/Pleura: There is atelectasis at the lung bases. There is no
pneumothorax. No significant pleural effusion. The trachea is
unremarkable.

Musculoskeletal: There is a displaced fracture of the sternal body
without evidence for significant retrosternal hematoma. There is a
0.8 cm right-sided breast nodule.

CT ABDOMEN PELVIS FINDINGS

Hepatobiliary: There are few hypodensities in the liver that are too
small to characterize but statistically are most likely to represent
benign cysts. Normal gallbladder.There is intrahepatic and
extrahepatic biliary ductal dilatation of unknown clinical
significance.

Pancreas: Normal contours without ductal dilatation. No
peripancreatic fluid collection.

Spleen: No splenic laceration or hematoma.

Adrenals/Urinary Tract:

--Adrenal glands: No adrenal hemorrhage.

--Right kidney/ureter: There is a punctate nonobstructing stone in
the interpolar region of the right kidney.

--Left kidney/ureter: There is a nonobstructing stone in the upper
pole the left kidney. There are multiple left-sided cortical
hypodensities are too small to characterize but are statistically
most likely to represent benign cysts.

--Urinary bladder: Unremarkable.

Stomach/Bowel:

--Stomach/Duodenum: No hiatal hernia or other gastric abnormality.
Normal duodenal course and caliber.

--Small bowel: No dilatation or inflammation.

--Colon: There is a large amount of stool in the colon. There are
scattered diverticula without CT evidence for diverticulitis.

--Appendix: Normal.

Vascular/Lymphatic: Atherosclerotic calcification is present within
the non-aneurysmal abdominal aorta, without hemodynamically
significant stenosis.

--No retroperitoneal lymphadenopathy.

--No mesenteric lymphadenopathy.

--No pelvic or inguinal lymphadenopathy.

Reproductive: Status post hysterectomy. No adnexal mass.

Other: No ascites or free air. The abdominal wall is normal.

Musculoskeletal. No acute displaced fractures.
IMPRESSION: 1. No acute intracranial process.
2. Mildly displaced fracture through the anterior arch of C1. The
right lateral aspect of the C1 vertebral body appears to be perched
on the facet of C2.
3. Acute displaced type 3 dens fracture. There is likely a fracture
through the left C2 pedicle with disruption of the transverse
foramen on the left. A vertebral artery injury is not excluded on
this exam and should be correlated with CTA of the neck.
4. There is probable disruption of the posterior atlanto axial
membrane at the C1-C2 level.
5. Displaced sternal body fracture without evidence for significant
retrosternal hematoma.
6. No acute intra-abdominal or pelvic injury.
7. Intra and extrahepatic biliary ductal dilatation of unknown
clinical significance. Correlate with LFTs. If there is concern for
biliary obstruction, consider further evaluation with MRCP.
8. Large amount of stool in the colon.
9. Bilateral nonobstructive nephrolithiasis.
10. Colonic diverticulosis without diverticulitis.

Aortic Atherosclerosis ([id number].W).

These results were called by telephone at the time of interpretation
on 03/04/2019 at [DATE] to Dr. Yoel , who verbally acknowledged
these results.

## 2020-08-12 IMAGING — CT CT HEAD W/O CM
1 of 7 series · 2 of 47 positions shown, 3 images · IV contrast (agent unspecified)
Comparison: CT dated July 15, 2016.
COMPARISON: CT dated July 15, 2016.
COMPARISON: CT dated July 15, 2016.
COMPARISON: CT dated July 15, 2016.

Addendum:
CLINICAL DATA: Acute pain due to trauma

EXAM:
CT HEAD WITHOUT CONTRAST
CT CERVICAL SPINE WITHOUT CONTRAST
CT CHEST, ABDOMEN AND PELVIS WITH CONTRAST
TECHNIQUE: Contiguous axial images were obtained from the base of the skull
through the vertex without intravenous contrast.

[Series 2: head wo · axial · 0.41mm/px · z∈[+212,+257]mm · 2 of 29 slices shown, 3 images]
[im 10/29  brain]
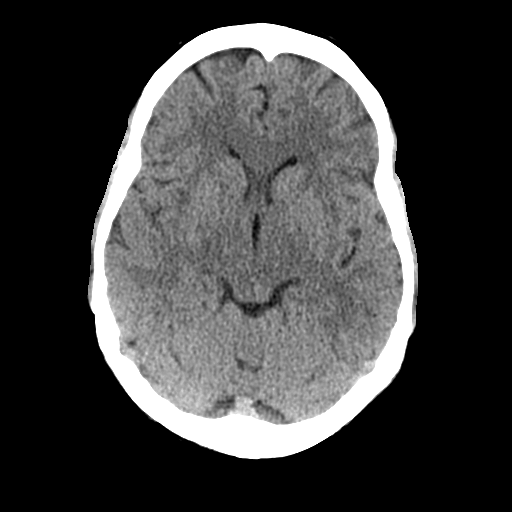
[im 10/29  bone]
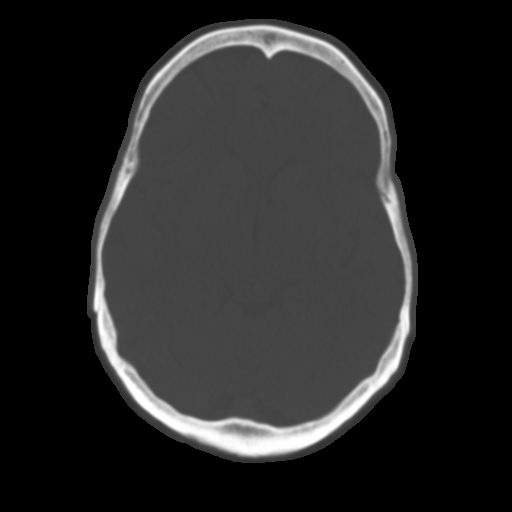
[im 19/29  brain]
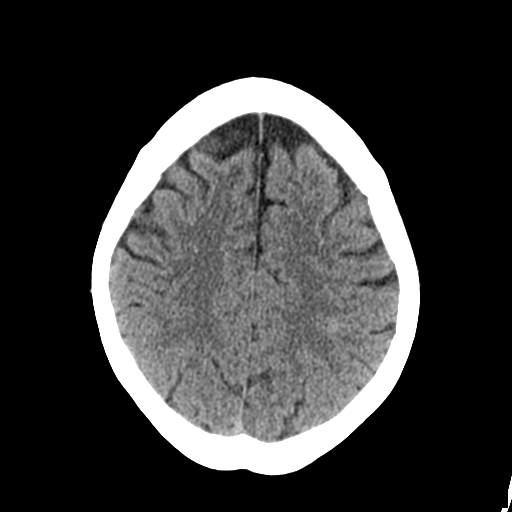

[2 of 47 positions shown; findings below may reference images not displayed]

Multidetector CT imaging of the cervical spine was performed without
intravenous contrast. Multiplanar CT image reconstructions were also
generated.

Multidetector CT imaging of the chest, abdomen and pelvis was
performed following the standard protocol during bolus
administration of intravenous contrast.

CONTRAST:  100mL OMNIPAQUE IOHEXOL 300 MG/ML  SOLN
FINDINGS: CT HEAD FINDINGS

Brain: No evidence of acute infarction, hemorrhage, hydrocephalus,
extra-axial collection or mass lesion/mass effect.

Vascular: No hyperdense vessel or unexpected calcification.

Skull: Normal. Negative for fracture or focal lesion.

Sinuses/Orbits: No acute finding.

Other: None.

CT CERVICAL FINDINGS

Alignment: See below.

Skull base and vertebrae: There is a mildly displaced fracture
through the anterior arch of C1. There is a significantly displaced
type 3 dens fracture. The right lateral aspect of the C1 vertebral
body appears to be perched on the facet of C2. There is likely
disruption of the ligamentum flavum at the upper cervical spine.

Soft tissues and spinal canal: There is minimal prevertebral soft
tissue swelling. There is no convincing CT evidence for a large
epidural hematoma.

Disc levels: There is multilevel disc height loss throughout the
cervical spine, greatest at the C5-C6 and C6-C7 levels.

Other:  None.

CT CHEST FINDINGS

Cardiovascular: There is no large centrally located pulmonary
embolus. There is no obvious dissection. Coronary artery
calcifications are noted.

Mediastinum/Nodes:

--No mediastinal or hilar lymphadenopathy.

--No axillary lymphadenopathy.

--No supraclavicular lymphadenopathy.

--Normal thyroid gland.

--The esophagus is unremarkable

Lungs/Pleura: There is atelectasis at the lung bases. There is no
pneumothorax. No significant pleural effusion. The trachea is
unremarkable.

Musculoskeletal: There is a displaced fracture of the sternal body
without evidence for significant retrosternal hematoma. There is a
0.8 cm right-sided breast nodule.

CT ABDOMEN PELVIS FINDINGS

Hepatobiliary: There are few hypodensities in the liver that are too
small to characterize but statistically are most likely to represent
benign cysts. Normal gallbladder.There is intrahepatic and
extrahepatic biliary ductal dilatation of unknown clinical
significance.

Pancreas: Normal contours without ductal dilatation. No
peripancreatic fluid collection.

Spleen: No splenic laceration or hematoma.

Adrenals/Urinary Tract:

--Adrenal glands: No adrenal hemorrhage.

--Right kidney/ureter: There is a punctate nonobstructing stone in
the interpolar region of the right kidney.

--Left kidney/ureter: There is a nonobstructing stone in the upper
pole the left kidney. There are multiple left-sided cortical
hypodensities are too small to characterize but are statistically
most likely to represent benign cysts.

--Urinary bladder: Unremarkable.

Stomach/Bowel:

--Stomach/Duodenum: No hiatal hernia or other gastric abnormality.
Normal duodenal course and caliber.

--Small bowel: No dilatation or inflammation.

--Colon: There is a large amount of stool in the colon. There are
scattered diverticula without CT evidence for diverticulitis.

--Appendix: Normal.

Vascular/Lymphatic: Atherosclerotic calcification is present within
the non-aneurysmal abdominal aorta, without hemodynamically
significant stenosis.

--No retroperitoneal lymphadenopathy.

--No mesenteric lymphadenopathy.

--No pelvic or inguinal lymphadenopathy.

Reproductive: Status post hysterectomy. No adnexal mass.

Other: No ascites or free air. The abdominal wall is normal.

Musculoskeletal. No acute displaced fractures.
IMPRESSION: 1. No acute intracranial process.
2. Mildly displaced fracture through the anterior arch of C1. The
right lateral aspect of the C1 vertebral body appears to be perched
on the facet of C2.
3. Acute displaced type 3 dens fracture. There is likely a fracture
through the left C2 pedicle with disruption of the transverse
foramen on the left. A vertebral artery injury is not excluded on
this exam and should be correlated with CTA of the neck.
4. There is probable disruption of the posterior atlanto axial
membrane at the C1-C2 level.
5. Displaced sternal body fracture without evidence for significant
retrosternal hematoma.
6. No acute intra-abdominal or pelvic injury.
7. Intra and extrahepatic biliary ductal dilatation of unknown
clinical significance. Correlate with LFTs. If there is concern for
biliary obstruction, consider further evaluation with MRCP.
8. Large amount of stool in the colon.
9. Bilateral nonobstructive nephrolithiasis.
10. Colonic diverticulosis without diverticulitis.

Aortic Atherosclerosis (YEJ1K-FRS.S).

These results were called by telephone at the time of interpretation
on 03/04/2019 at [DATE] to Dr. Bobo , who verbally acknowledged
these results.

ADDENDUM:
There is a 0.8 cm right-sided breast nodule. Follow-up with
outpatient mammography is recommended for further evaluation of this
finding.

ADDENDUM:
There is a 0.8 cm right-sided breast nodule. Follow-up with
outpatient mammography is recommended for further evaluation of this
finding.

ADDENDUM:
There is a 0.8 cm right-sided breast nodule. Follow-up with
outpatient mammography is recommended for further evaluation of this
finding.

ADDENDUM:
There is a 0.8 cm right-sided breast nodule. Follow-up with
outpatient mammography is recommended for further evaluation of this
finding.

*** End of Addendum ***
Multidetector CT imaging of the cervical spine was performed without
intravenous contrast. Multiplanar CT image reconstructions were also
generated.

Multidetector CT imaging of the chest, abdomen and pelvis was
performed following the standard protocol during bolus
administration of intravenous contrast.

CONTRAST:  100mL OMNIPAQUE IOHEXOL 300 MG/ML  SOLN
FINDINGS: CT HEAD FINDINGS

Brain: No evidence of acute infarction, hemorrhage, hydrocephalus,
extra-axial collection or mass lesion/mass effect.

Vascular: No hyperdense vessel or unexpected calcification.

Skull: Normal. Negative for fracture or focal lesion.

Sinuses/Orbits: No acute finding.

Other: None.

CT CERVICAL FINDINGS

Alignment: See below.

Skull base and vertebrae: There is a mildly displaced fracture
through the anterior arch of C1. There is a significantly displaced
type 3 dens fracture. The right lateral aspect of the C1 vertebral
body appears to be perched on the facet of C2. There is likely
disruption of the ligamentum flavum at the upper cervical spine.

Soft tissues and spinal canal: There is minimal prevertebral soft
tissue swelling. There is no convincing CT evidence for a large
epidural hematoma.

Disc levels: There is multilevel disc height loss throughout the
cervical spine, greatest at the C5-C6 and C6-C7 levels.

Other:  None.

CT CHEST FINDINGS

Cardiovascular: There is no large centrally located pulmonary
embolus. There is no obvious dissection. Coronary artery
calcifications are noted.

Mediastinum/Nodes:

--No mediastinal or hilar lymphadenopathy.

--No axillary lymphadenopathy.

--No supraclavicular lymphadenopathy.

--Normal thyroid gland.

--The esophagus is unremarkable

Lungs/Pleura: There is atelectasis at the lung bases. There is no
pneumothorax. No significant pleural effusion. The trachea is
unremarkable.

Musculoskeletal: There is a displaced fracture of the sternal body
without evidence for significant retrosternal hematoma. There is a
0.8 cm right-sided breast nodule.

CT ABDOMEN PELVIS FINDINGS

Hepatobiliary: There are few hypodensities in the liver that are too
small to characterize but statistically are most likely to represent
benign cysts. Normal gallbladder.There is intrahepatic and
extrahepatic biliary ductal dilatation of unknown clinical
significance.

Pancreas: Normal contours without ductal dilatation. No
peripancreatic fluid collection.

Spleen: No splenic laceration or hematoma.

Adrenals/Urinary Tract:

--Adrenal glands: No adrenal hemorrhage.

--Right kidney/ureter: There is a punctate nonobstructing stone in
the interpolar region of the right kidney.

--Left kidney/ureter: There is a nonobstructing stone in the upper
pole the left kidney. There are multiple left-sided cortical
hypodensities are too small to characterize but are statistically
most likely to represent benign cysts.

--Urinary bladder: Unremarkable.

Stomach/Bowel:

--Stomach/Duodenum: No hiatal hernia or other gastric abnormality.
Normal duodenal course and caliber.

--Small bowel: No dilatation or inflammation.

--Colon: There is a large amount of stool in the colon. There are
scattered diverticula without CT evidence for diverticulitis.

--Appendix: Normal.

Vascular/Lymphatic: Atherosclerotic calcification is present within
the non-aneurysmal abdominal aorta, without hemodynamically
significant stenosis.

--No retroperitoneal lymphadenopathy.

--No mesenteric lymphadenopathy.

--No pelvic or inguinal lymphadenopathy.

Reproductive: Status post hysterectomy. No adnexal mass.

Other: No ascites or free air. The abdominal wall is normal.

Musculoskeletal. No acute displaced fractures.
IMPRESSION: 1. No acute intracranial process.
2. Mildly displaced fracture through the anterior arch of C1. The
right lateral aspect of the C1 vertebral body appears to be perched
on the facet of C2.
3. Acute displaced type 3 dens fracture. There is likely a fracture
through the left C2 pedicle with disruption of the transverse
foramen on the left. A vertebral artery injury is not excluded on
this exam and should be correlated with CTA of the neck.
4. There is probable disruption of the posterior atlanto axial
membrane at the C1-C2 level.
5. Displaced sternal body fracture without evidence for significant
retrosternal hematoma.
6. No acute intra-abdominal or pelvic injury.
7. Intra and extrahepatic biliary ductal dilatation of unknown
clinical significance. Correlate with LFTs. If there is concern for
biliary obstruction, consider further evaluation with MRCP.
8. Large amount of stool in the colon.
9. Bilateral nonobstructive nephrolithiasis.
10. Colonic diverticulosis without diverticulitis.

Aortic Atherosclerosis (YEJ1K-FRS.S).

These results were called by telephone at the time of interpretation
on 03/04/2019 at [DATE] to Dr. Bobo , who verbally acknowledged
these results.

Addendum:
Multidetector CT imaging of the cervical spine was performed without
intravenous contrast. Multiplanar CT image reconstructions were also
generated.

Multidetector CT imaging of the chest, abdomen and pelvis was
performed following the standard protocol during bolus
administration of intravenous contrast.

CONTRAST:  100mL OMNIPAQUE IOHEXOL 300 MG/ML  SOLN
FINDINGS: CT HEAD FINDINGS

Brain: No evidence of acute infarction, hemorrhage, hydrocephalus,
extra-axial collection or mass lesion/mass effect.

Vascular: No hyperdense vessel or unexpected calcification.

Skull: Normal. Negative for fracture or focal lesion.

Sinuses/Orbits: No acute finding.

Other: None.

CT CERVICAL FINDINGS

Alignment: See below.

Skull base and vertebrae: There is a mildly displaced fracture
through the anterior arch of C1. There is a significantly displaced
type 3 dens fracture. The right lateral aspect of the C1 vertebral
body appears to be perched on the facet of C2. There is likely
disruption of the ligamentum flavum at the upper cervical spine.

Soft tissues and spinal canal: There is minimal prevertebral soft
tissue swelling. There is no convincing CT evidence for a large
epidural hematoma.

Disc levels: There is multilevel disc height loss throughout the
cervical spine, greatest at the C5-C6 and C6-C7 levels.

Other:  None.

CT CHEST FINDINGS

Cardiovascular: There is no large centrally located pulmonary
embolus. There is no obvious dissection. Coronary artery
calcifications are noted.

Mediastinum/Nodes:

--No mediastinal or hilar lymphadenopathy.

--No axillary lymphadenopathy.

--No supraclavicular lymphadenopathy.

--Normal thyroid gland.

--The esophagus is unremarkable

Lungs/Pleura: There is atelectasis at the lung bases. There is no
pneumothorax. No significant pleural effusion. The trachea is
unremarkable.

Musculoskeletal: There is a displaced fracture of the sternal body
without evidence for significant retrosternal hematoma. There is a
0.8 cm right-sided breast nodule.

CT ABDOMEN PELVIS FINDINGS

Hepatobiliary: There are few hypodensities in the liver that are too
small to characterize but statistically are most likely to represent
benign cysts. Normal gallbladder.There is intrahepatic and
extrahepatic biliary ductal dilatation of unknown clinical
significance.

Pancreas: Normal contours without ductal dilatation. No
peripancreatic fluid collection.

Spleen: No splenic laceration or hematoma.

Adrenals/Urinary Tract:

--Adrenal glands: No adrenal hemorrhage.

--Right kidney/ureter: There is a punctate nonobstructing stone in
the interpolar region of the right kidney.

--Left kidney/ureter: There is a nonobstructing stone in the upper
pole the left kidney. There are multiple left-sided cortical
hypodensities are too small to characterize but are statistically
most likely to represent benign cysts.

--Urinary bladder: Unremarkable.

Stomach/Bowel:

--Stomach/Duodenum: No hiatal hernia or other gastric abnormality.
Normal duodenal course and caliber.

--Small bowel: No dilatation or inflammation.

--Colon: There is a large amount of stool in the colon. There are
scattered diverticula without CT evidence for diverticulitis.

--Appendix: Normal.

Vascular/Lymphatic: Atherosclerotic calcification is present within
the non-aneurysmal abdominal aorta, without hemodynamically
significant stenosis.

--No retroperitoneal lymphadenopathy.

--No mesenteric lymphadenopathy.

--No pelvic or inguinal lymphadenopathy.

Reproductive: Status post hysterectomy. No adnexal mass.

Other: No ascites or free air. The abdominal wall is normal.

Musculoskeletal. No acute displaced fractures.
IMPRESSION: 1. No acute intracranial process.
2. Mildly displaced fracture through the anterior arch of C1. The
right lateral aspect of the C1 vertebral body appears to be perched
on the facet of C2.
3. Acute displaced type 3 dens fracture. There is likely a fracture
through the left C2 pedicle with disruption of the transverse
foramen on the left. A vertebral artery injury is not excluded on
this exam and should be correlated with CTA of the neck.
4. There is probable disruption of the posterior atlanto axial
membrane at the C1-C2 level.
5. Displaced sternal body fracture without evidence for significant
retrosternal hematoma.
6. No acute intra-abdominal or pelvic injury.
7. Intra and extrahepatic biliary ductal dilatation of unknown
clinical significance. Correlate with LFTs. If there is concern for
biliary obstruction, consider further evaluation with MRCP.
8. Large amount of stool in the colon.
9. Bilateral nonobstructive nephrolithiasis.
10. Colonic diverticulosis without diverticulitis.

Aortic Atherosclerosis (RZXDY-OTW.W).

These results were called by telephone at the time of interpretation
on 03/04/2019 at [DATE] to Dr. Yoel , who verbally acknowledged
these results.

ADDENDUM:
There is a 0.8 cm right-sided breast nodule. Follow-up with
outpatient mammography is recommended for further evaluation of this
finding.

*** End of Addendum ***
Multidetector CT imaging of the cervical spine was performed without
intravenous contrast. Multiplanar CT image reconstructions were also
generated.

Multidetector CT imaging of the chest, abdomen and pelvis was
performed following the standard protocol during bolus
administration of intravenous contrast.

CONTRAST:  100mL OMNIPAQUE IOHEXOL 300 MG/ML  SOLN
FINDINGS: CT HEAD FINDINGS

Brain: No evidence of acute infarction, hemorrhage, hydrocephalus,
extra-axial collection or mass lesion/mass effect.

Vascular: No hyperdense vessel or unexpected calcification.

Skull: Normal. Negative for fracture or focal lesion.

Sinuses/Orbits: No acute finding.

Other: None.

CT CERVICAL FINDINGS

Alignment: See below.

Skull base and vertebrae: There is a mildly displaced fracture
through the anterior arch of C1. There is a significantly displaced
type 3 dens fracture. The right lateral aspect of the C1 vertebral
body appears to be perched on the facet of C2. There is likely
disruption of the ligamentum flavum at the upper cervical spine.

Soft tissues and spinal canal: There is minimal prevertebral soft
tissue swelling. There is no convincing CT evidence for a large
epidural hematoma.

Disc levels: There is multilevel disc height loss throughout the
cervical spine, greatest at the C5-C6 and C6-C7 levels.

Other:  None.

CT CHEST FINDINGS

Cardiovascular: There is no large centrally located pulmonary
embolus. There is no obvious dissection. Coronary artery
calcifications are noted.

Mediastinum/Nodes:

--No mediastinal or hilar lymphadenopathy.

--No axillary lymphadenopathy.

--No supraclavicular lymphadenopathy.

--Normal thyroid gland.

--The esophagus is unremarkable

Lungs/Pleura: There is atelectasis at the lung bases. There is no
pneumothorax. No significant pleural effusion. The trachea is
unremarkable.

Musculoskeletal: There is a displaced fracture of the sternal body
without evidence for significant retrosternal hematoma. There is a
0.8 cm right-sided breast nodule.

CT ABDOMEN PELVIS FINDINGS

Hepatobiliary: There are few hypodensities in the liver that are too
small to characterize but statistically are most likely to represent
benign cysts. Normal gallbladder.There is intrahepatic and
extrahepatic biliary ductal dilatation of unknown clinical
significance.

Pancreas: Normal contours without ductal dilatation. No
peripancreatic fluid collection.

Spleen: No splenic laceration or hematoma.

Adrenals/Urinary Tract:

--Adrenal glands: No adrenal hemorrhage.

--Right kidney/ureter: There is a punctate nonobstructing stone in
the interpolar region of the right kidney.

--Left kidney/ureter: There is a nonobstructing stone in the upper
pole the left kidney. There are multiple left-sided cortical
hypodensities are too small to characterize but are statistically
most likely to represent benign cysts.

--Urinary bladder: Unremarkable.

Stomach/Bowel:

--Stomach/Duodenum: No hiatal hernia or other gastric abnormality.
Normal duodenal course and caliber.

--Small bowel: No dilatation or inflammation.

--Colon: There is a large amount of stool in the colon. There are
scattered diverticula without CT evidence for diverticulitis.

--Appendix: Normal.

Vascular/Lymphatic: Atherosclerotic calcification is present within
the non-aneurysmal abdominal aorta, without hemodynamically
significant stenosis.

--No retroperitoneal lymphadenopathy.

--No mesenteric lymphadenopathy.

--No pelvic or inguinal lymphadenopathy.

Reproductive: Status post hysterectomy. No adnexal mass.

Other: No ascites or free air. The abdominal wall is normal.

Musculoskeletal. No acute displaced fractures.
IMPRESSION: 1. No acute intracranial process.
2. Mildly displaced fracture through the anterior arch of C1. The
right lateral aspect of the C1 vertebral body appears to be perched
on the facet of C2.
3. Acute displaced type 3 dens fracture. There is likely a fracture
through the left C2 pedicle with disruption of the transverse
foramen on the left. A vertebral artery injury is not excluded on
this exam and should be correlated with CTA of the neck.
4. There is probable disruption of the posterior atlanto axial
membrane at the C1-C2 level.
5. Displaced sternal body fracture without evidence for significant
retrosternal hematoma.
6. No acute intra-abdominal or pelvic injury.
7. Intra and extrahepatic biliary ductal dilatation of unknown
clinical significance. Correlate with LFTs. If there is concern for
biliary obstruction, consider further evaluation with MRCP.
8. Large amount of stool in the colon.
9. Bilateral nonobstructive nephrolithiasis.
10. Colonic diverticulosis without diverticulitis.

Aortic Atherosclerosis (RZXDY-OTW.W).

These results were called by telephone at the time of interpretation
on 03/04/2019 at [DATE] to Dr. Yoel , who verbally acknowledged
these results.

## 2020-08-17 ENCOUNTER — Ambulatory Visit: Payer: 59 | Admitting: Primary Care

## 2020-08-21 ENCOUNTER — Other Ambulatory Visit: Payer: Self-pay

## 2020-08-21 ENCOUNTER — Ambulatory Visit (INDEPENDENT_AMBULATORY_CARE_PROVIDER_SITE_OTHER): Payer: 59 | Admitting: Primary Care

## 2020-08-21 ENCOUNTER — Encounter: Payer: Self-pay | Admitting: Primary Care

## 2020-08-21 VITALS — BP 162/116 | HR 78 | Temp 97.7°F | Wt 165.2 lb

## 2020-08-21 DIAGNOSIS — E538 Deficiency of other specified B group vitamins: Secondary | ICD-10-CM | POA: Diagnosis not present

## 2020-08-21 DIAGNOSIS — F32A Depression, unspecified: Secondary | ICD-10-CM | POA: Diagnosis not present

## 2020-08-21 DIAGNOSIS — F419 Anxiety disorder, unspecified: Secondary | ICD-10-CM | POA: Diagnosis not present

## 2020-08-21 DIAGNOSIS — I10 Essential (primary) hypertension: Secondary | ICD-10-CM

## 2020-08-21 DIAGNOSIS — R5383 Other fatigue: Secondary | ICD-10-CM

## 2020-08-21 MED ORDER — DULOXETINE HCL 30 MG PO CPEP: 30.0000 mg | ORAL_CAPSULE | Freq: Every day | ORAL | 3 refills | Status: DC

## 2020-08-21 MED ORDER — CYANOCOBALAMIN 1000 MCG/ML IJ SOLN
1000.0000 ug | Freq: Once | INTRAMUSCULAR | Status: AC
  Administered 2020-08-21: 1000 ug via INTRAMUSCULAR

## 2020-08-21 NOTE — Patient Instructions (Addendum)
Continue taking duloxetine 30 mg daily for anxiety and depression.  Stop by the lab prior to leaving today. I will notify you of your results once received.   Schedule a nurse visit to receive your next B12 injection in 2 weeks. Then schedule once monthly B12 injections for an additional three months.  Schedule a lab appointment for 4 months to repeat your B12 level.  It was a pleasure to see you today!

## 2020-08-21 NOTE — Assessment & Plan Note (Signed)
Recent B12 level of 148, will initiate injections. Anxiety and depression improved with duloxetine 30mg . 

## 2020-08-21 NOTE — Progress Notes (Signed)
Subjective:    Patient ID: Christina [name], female    DOB: 08/08/1955, 65 y.o.   MRN: [id number]  HPI  This visit occurred during the SARS-CoV-2 public health emergency.  Safety protocols were in place, including screening questions prior to the visit, additional usage of staff PPE, and extensive cleaning of exam room while observing appropriate contact time as indicated for disinfecting solutions.   [name] is a 65 year old female with a history of hypertension, IBS, osteoarthritis, anxiety and depression, B12 deficiency who presents today for follow up of anxiety and depression. She is also due for her B12 injection.  She was last evaluated in early January 2022 for CPE, anxiety and depression and deteriorated, increased symptoms with GAD 7 and PHQ 9 scores of 21. Given ongoing symptoms we re-initiated duloxetine 30 mg as she had taken this previously. She is here today for follow up.  Since her last visit she's feeling much better! She didn't start taking duloxetine until 07/23/20 as she contracted Covid-19 after our visit. Positive symptoms include improved mood, improved motivation, less tearfulness. She is sleeping much better with "Simply Sleep". She continues to feel fatigued which she suspects is secondary to Covid-19 that she had in mid January 2022.   She forgot to take all of her mediations today. During her last visit we found B12 level to be 148, also with slightly elevated potassium. She is compliant to her lisinopril 20 mg daily, does not take B12 supplements.   BP Readings from Last 3 Encounters:  08/21/20 (!) 162/116  07/04/20 134/80  03/20/20 126/86     Review of Systems  Constitutional: Positive for fatigue.  Psychiatric/Behavioral: Negative for sleep disturbance. The patient is not nervous/anxious.        See HPI       Past Medical History:  Diagnosis Date  . Depression   . GAD (generalized anxiety disorder)   . Genital herpes   . Osteoarthritis   . Seasonal  allergies      Social History   Socioeconomic History  . Marital status: Married    Spouse name: Not on file  . Number of children: Not on file  . Years of education: Not on file  . Highest education level: Not on file  Occupational History  . Not on file  Tobacco Use  . Smoking status: Never Smoker  . Smokeless tobacco: Never Used  Vaping Use  . Vaping Use: Never used  Substance and Sexual Activity  . Alcohol use: Yes    Alcohol/week: 0.0 standard drinks    Comment: daily   . Drug use: No  . Sexual activity: Yes    Birth control/protection: Surgical    Comment: FEB. 1980, 4 PARTNERS.  Other Topics Concern  . Not on file  Social History Narrative   Married.   2 children.   Works as Arts development officer.    Social Determinants of Health   Financial Resource Strain: Not on file  Food Insecurity: Not on file  Transportation Needs: Not on file  Physical Activity: Not on file  Stress: Not on file  Social Connections: Not on file  Intimate Partner Violence: Not on file    Past Surgical History:  Procedure Laterality Date  . CARPAL TUNNEL RELEASE Bilateral 05/21/2013   BILATERAL CARPAL TUNNEL SURGERY  . CESAREAN SECTION  1990  . CESAREAN SECTION  1992  . VAGINAL HYSTERECTOMY      Family History  Problem Relation Age of Onset  .  Cancer Mother        brain  . Ovarian cancer Mother   . Cancer Father        prostate  . Breast cancer Maternal Grandmother   . Ovarian cancer Maternal Grandmother   . Heart Problems Brother   . Healthy Daughter   . Healthy Daughter   . Healthy Son   . Healthy Son     Allergies  Allergen Reactions  . Toradol [Ketorolac Tromethamine] Nausea And Vomiting  . Ultram [Tramadol] Nausea And Vomiting    Current Outpatient Medications on File Prior to Visit  Medication Sig Dispense Refill  . acyclovir ointment (ZOVIRAX) 5 % APPLY 1 APPLICATION TOPICALLY EVERY 3 HOURS. 30 g 2  . colestipol (COLESTID) 1 g tablet Take 1 g by mouth 2 (two) times  daily.    Marland Kitchen esomeprazole (NEXIUM) 40 MG capsule Take 40 mg by mouth daily.    Marland Kitchen estradiol (ESTRACE) 0.1 MG/GM vaginal cream Place 1 Applicatorful vaginally at bedtime. 42.5 g 12  . lisinopril (ZESTRIL) 20 MG tablet TAKE 1 TABLET BY MOUTH DAILY FOR BLOOD PRESSURE. 90 tablet 0  . ondansetron (ZOFRAN ODT) 4 MG disintegrating tablet Take 1 tablet (4 mg total) by mouth every 8 (eight) hours as needed for nausea or vomiting. 15 tablet 0  . valACYclovir (VALTREX) 500 MG tablet Take one tablet by mouth twice daily for 3-5 day for onset outbreak , then daily as needed. 90 tablet 3   No current facility-administered medications on file prior to visit.    BP (!) 162/116 (BP Location: Right Arm, Patient Position: Sitting, Cuff Size: Normal)   Pulse 78   Temp 97.7 F (36.5 C) (Temporal)   Wt 165 lb 4 oz (75 kg)   SpO2 99%   BMI 28.37 kg/m    Objective:   Physical Exam Constitutional:      Appearance: She is well-nourished.  Cardiovascular:     Rate and Rhythm: Normal rate and regular rhythm.  Pulmonary:     Effort: Pulmonary effort is normal.     Breath sounds: Normal breath sounds.  Musculoskeletal:     Cervical back: Neck supple.  Skin:    General: Skin is warm and dry.  Psychiatric:        Mood and Affect: Mood and affect and mood normal.            Assessment & Plan:

## 2020-08-21 NOTE — Assessment & Plan Note (Addendum)
Level of 148 on prior labs, is not taking supplemental B12.   First B12 injection provided today.  Recommend repeat B12 in 2 weeks, then monthly thereafter x 3 months.  Repeat B12 level in 3-4 months.

## 2020-08-21 NOTE — Assessment & Plan Note (Signed)
Above goal in the office today, has not had medications as she forgot.  She has plenty of lisinopril 20 mg as she had received during hospital stays, she will notify when she is in need of a refill.  Repeat potassium level pending.

## 2020-08-21 NOTE — Assessment & Plan Note (Signed)
Significant improvement since last visit after duloxetine 30 mg. Continue same.  Refills sent to pharmacy.

## 2020-08-22 LAB — BASIC METABOLIC PANEL
BUN: 19 mg/dL (ref 6–23)
CO2: 28 mEq/L (ref 19–32)
Calcium: 10 mg/dL (ref 8.4–10.5)
Chloride: 104 mEq/L (ref 96–112)
Creatinine, Ser: 0.85 mg/dL (ref 0.40–1.20)
GFR: 72.98 mL/min (ref >60.00)
Glucose, Bld: 93 mg/dL (ref 70–99)
Potassium: 4.8 mEq/L (ref 3.5–5.1)
Sodium: 139 mEq/L (ref 135–145)

## 2020-09-06 ENCOUNTER — Other Ambulatory Visit: Payer: Self-pay

## 2020-09-06 ENCOUNTER — Ambulatory Visit (INDEPENDENT_AMBULATORY_CARE_PROVIDER_SITE_OTHER): Payer: 59

## 2020-09-06 DIAGNOSIS — E538 Deficiency of other specified B group vitamins: Secondary | ICD-10-CM

## 2020-09-06 MED ORDER — CYANOCOBALAMIN 1000 MCG/ML IJ SOLN
1000.0000 ug | Freq: Once | INTRAMUSCULAR | Status: AC
Start: 2020-09-06 — End: 2020-09-06
  Administered 2020-09-06: 1000 ug via INTRAMUSCULAR

## 2020-09-06 NOTE — Progress Notes (Signed)
Per orders of Dr. Cody, injection of vit B12 given by Jeffory Snelgrove. Patient tolerated injection well.  

## 2020-10-04 ENCOUNTER — Ambulatory Visit: Payer: 59

## 2020-10-09 ENCOUNTER — Ambulatory Visit: Payer: 59

## 2020-10-11 ENCOUNTER — Ambulatory Visit (INDEPENDENT_AMBULATORY_CARE_PROVIDER_SITE_OTHER): Payer: 59

## 2020-10-11 ENCOUNTER — Other Ambulatory Visit: Payer: Self-pay

## 2020-10-11 DIAGNOSIS — E538 Deficiency of other specified B group vitamins: Secondary | ICD-10-CM

## 2020-10-11 MED ORDER — CYANOCOBALAMIN 1000 MCG/ML IJ SOLN
1000.0000 ug | Freq: Once | INTRAMUSCULAR | Status: AC
  Administered 2020-10-11: 1000 ug via INTRAMUSCULAR

## 2020-10-11 NOTE — Progress Notes (Signed)
Per orders of Vernona Rieger, NP, injection of Vitamin B12 given in right deltoid by Roena Malady.  Patient tolerated injection well.

## 2020-11-01 ENCOUNTER — Ambulatory Visit (INDEPENDENT_AMBULATORY_CARE_PROVIDER_SITE_OTHER): Payer: 59

## 2020-11-01 ENCOUNTER — Other Ambulatory Visit: Payer: Self-pay

## 2020-11-01 DIAGNOSIS — E538 Deficiency of other specified B group vitamins: Secondary | ICD-10-CM | POA: Diagnosis not present

## 2020-11-01 MED ORDER — CYANOCOBALAMIN 1000 MCG/ML IJ SOLN
1000.0000 ug | Freq: Once | INTRAMUSCULAR | Status: AC
  Administered 2020-11-01: 1000 ug via INTRAMUSCULAR

## 2020-11-01 NOTE — Progress Notes (Signed)
Per orders of Dr. Para March in the absence of Mayra Reel, NP, injection of B12, given by Selina Cooley, RN. Patient tolerated injection well in R Deltoid.

## 2020-11-06 ENCOUNTER — Ambulatory Visit: Payer: 59

## 2020-12-06 ENCOUNTER — Ambulatory Visit (INDEPENDENT_AMBULATORY_CARE_PROVIDER_SITE_OTHER): Payer: 59

## 2020-12-06 ENCOUNTER — Other Ambulatory Visit: Payer: Self-pay

## 2020-12-06 DIAGNOSIS — E538 Deficiency of other specified B group vitamins: Secondary | ICD-10-CM

## 2020-12-06 MED ORDER — CYANOCOBALAMIN 1000 MCG/ML IJ SOLN
1000.0000 ug | Freq: Once | INTRAMUSCULAR | Status: AC
  Administered 2020-12-06: 1000 ug via INTRAMUSCULAR

## 2020-12-06 NOTE — Progress Notes (Signed)
Per orders of NP, Kate Clark, injection of vit B12 given by Mannix Kroeker. Patient tolerated injection well.  

## 2020-12-26 ENCOUNTER — Other Ambulatory Visit: Payer: Self-pay | Admitting: Primary Care

## 2020-12-26 DIAGNOSIS — R5383 Other fatigue: Secondary | ICD-10-CM

## 2020-12-26 DIAGNOSIS — E538 Deficiency of other specified B group vitamins: Secondary | ICD-10-CM

## 2021-01-01 ENCOUNTER — Ambulatory Visit (INDEPENDENT_AMBULATORY_CARE_PROVIDER_SITE_OTHER): Payer: 59

## 2021-01-01 ENCOUNTER — Other Ambulatory Visit: Payer: Self-pay

## 2021-01-01 DIAGNOSIS — E538 Deficiency of other specified B group vitamins: Secondary | ICD-10-CM | POA: Diagnosis not present

## 2021-01-01 MED ORDER — CYANOCOBALAMIN 1000 MCG/ML IJ SOLN
1000.0000 ug | Freq: Once | INTRAMUSCULAR | Status: AC
  Administered 2021-01-01: 1000 ug via INTRAMUSCULAR

## 2021-01-01 NOTE — Progress Notes (Signed)
Per orders of Mayra Reel, NP, 1st monthly injection of B12 1000 mcg/ml given by Eual Fines, CMA in right deltoid.  Patient tolerated injection well.

## 2021-01-08 ENCOUNTER — Other Ambulatory Visit: Payer: 59

## 2021-02-05 ENCOUNTER — Ambulatory Visit: Payer: 59

## 2021-02-13 ENCOUNTER — Other Ambulatory Visit: Payer: Self-pay

## 2021-02-13 ENCOUNTER — Ambulatory Visit (INDEPENDENT_AMBULATORY_CARE_PROVIDER_SITE_OTHER): Payer: 59

## 2021-02-13 DIAGNOSIS — E538 Deficiency of other specified B group vitamins: Secondary | ICD-10-CM | POA: Diagnosis not present

## 2021-02-13 MED ORDER — CYANOCOBALAMIN 1000 MCG/ML IJ SOLN
1000.0000 ug | Freq: Once | INTRAMUSCULAR | Status: AC
  Administered 2021-02-13: 1000 ug via INTRAMUSCULAR

## 2021-02-13 NOTE — Progress Notes (Signed)
Per orders of Dr. Gweneth Dimitri , injection of B-12 given by Donnamarie Poag in left deltoid. Patient tolerated injection well. Patient will make appointment for follow up with Jae Dire 8/30 to review forms and discuss if labs need to be check. I have form that will need to be addressed and holding for appointment.

## 2021-02-25 DIAGNOSIS — I10 Essential (primary) hypertension: Secondary | ICD-10-CM

## 2021-02-27 ENCOUNTER — Ambulatory Visit: Payer: 59 | Admitting: Primary Care

## 2021-02-28 MED ORDER — LISINOPRIL 20 MG PO TABS: 20.0000 mg | ORAL_TABLET | Freq: Every day | ORAL | 0 refills | Status: DC

## 2021-02-28 NOTE — Telephone Encounter (Signed)
Pt called to follow up on Mychart message  °

## 2021-03-12 ENCOUNTER — Ambulatory Visit: Payer: 59

## 2021-03-14 ENCOUNTER — Other Ambulatory Visit: Payer: Self-pay

## 2021-03-14 ENCOUNTER — Encounter: Payer: Self-pay | Admitting: Primary Care

## 2021-03-14 ENCOUNTER — Ambulatory Visit (INDEPENDENT_AMBULATORY_CARE_PROVIDER_SITE_OTHER): Payer: 59 | Admitting: Primary Care

## 2021-03-14 VITALS — BP 130/82 | HR 82 | Temp 98.6°F | Ht 64.0 in | Wt 156.0 lb

## 2021-03-14 DIAGNOSIS — E559 Vitamin D deficiency, unspecified: Secondary | ICD-10-CM

## 2021-03-14 DIAGNOSIS — F32A Depression, unspecified: Secondary | ICD-10-CM

## 2021-03-14 DIAGNOSIS — M542 Cervicalgia: Secondary | ICD-10-CM

## 2021-03-14 DIAGNOSIS — I10 Essential (primary) hypertension: Secondary | ICD-10-CM

## 2021-03-14 DIAGNOSIS — R6889 Other general symptoms and signs: Secondary | ICD-10-CM

## 2021-03-14 DIAGNOSIS — E538 Deficiency of other specified B group vitamins: Secondary | ICD-10-CM

## 2021-03-14 DIAGNOSIS — G8929 Other chronic pain: Secondary | ICD-10-CM

## 2021-03-14 DIAGNOSIS — F419 Anxiety disorder, unspecified: Secondary | ICD-10-CM

## 2021-03-14 LAB — COMPREHENSIVE METABOLIC PANEL
ALT: 16 U/L (ref 0–35)
AST: 20 U/L (ref 0–37)
Albumin: 4 g/dL (ref 3.5–5.2)
Alkaline Phosphatase: 77 U/L (ref 39–117)
BUN: 18 mg/dL (ref 6–23)
CO2: 30 mEq/L (ref 19–32)
Calcium: 9.4 mg/dL (ref 8.4–10.5)
Chloride: 104 mEq/L (ref 96–112)
Creatinine, Ser: 0.76 mg/dL (ref 0.40–1.20)
GFR: 83.14 mL/min (ref >60.00)
Glucose, Bld: 87 mg/dL (ref 70–99)
Potassium: 4.9 mEq/L (ref 3.5–5.1)
Sodium: 140 mEq/L (ref 135–145)
Total Bilirubin: 0.4 mg/dL (ref 0.2–1.2)
Total Protein: 7 g/dL (ref 6.0–8.3)

## 2021-03-14 LAB — VITAMIN D 25 HYDROXY (VIT D DEFICIENCY, FRACTURES): VITD: 35.26 ng/mL (ref 30.00–100.00)

## 2021-03-14 LAB — VITAMIN B12: Vitamin B-12: 298 pg/mL (ref 211–911)

## 2021-03-14 MED ORDER — CYANOCOBALAMIN 1000 MCG/ML IJ SOLN
1000.0000 ug | Freq: Once | INTRAMUSCULAR | Status: AC
  Administered 2021-03-14: 1000 ug via INTRAMUSCULAR

## 2021-03-14 NOTE — Assessment & Plan Note (Signed)
Well controlled in the office today.  °Continue lisinopril 20 mg.  ° °CMP pending.  °

## 2021-03-14 NOTE — Assessment & Plan Note (Signed)
Improved and satisfied with Cymbalta 30 mg, continue same.

## 2021-03-14 NOTE — Assessment & Plan Note (Signed)
Compliant to monthly injections, repeat B12 level pending.
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
## 2021-03-14 NOTE — Assessment & Plan Note (Signed)
Not currently on treatment, will check D levels.

## 2021-03-14 NOTE — Patient Instructions (Signed)
Stop by the lab prior to leaving today. I will notify you of your results once received.   It was a pleasure to see you today!  
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
## 2021-03-14 NOTE — Addendum Note (Signed)
Addended by: Donnamarie Poag on: 03/14/2021 10:46 AM   Modules accepted: Orders

## 2021-05-07 ENCOUNTER — Other Ambulatory Visit: Payer: Self-pay | Admitting: Nurse Practitioner

## 2021-05-07 DIAGNOSIS — B009 Herpesviral infection, unspecified: Secondary | ICD-10-CM

## 2021-05-07 NOTE — Telephone Encounter (Addendum)
Patient called triage and left voice mail requesting refill for Valtrex Last annual 10/11/2019 with Christina Rowan NP Needs up to date annual [facility] appointment pool aware to call and set up annual exam  I will route to Provider for approval or denial

## 2021-05-28 ENCOUNTER — Other Ambulatory Visit: Payer: Self-pay | Admitting: Primary Care

## 2021-05-28 DIAGNOSIS — I10 Essential (primary) hypertension: Secondary | ICD-10-CM

## 2021-06-17 ENCOUNTER — Encounter: Payer: Self-pay | Admitting: Nurse Practitioner

## 2021-06-17 ENCOUNTER — Ambulatory Visit (INDEPENDENT_AMBULATORY_CARE_PROVIDER_SITE_OTHER): Payer: 59 | Admitting: Nurse Practitioner

## 2021-06-17 ENCOUNTER — Other Ambulatory Visit: Payer: Self-pay

## 2021-06-17 VITALS — BP 124/82 | Ht 64.0 in | Wt 171.0 lb

## 2021-06-17 DIAGNOSIS — M8589 Other specified disorders of bone density and structure, multiple sites: Secondary | ICD-10-CM

## 2021-06-17 DIAGNOSIS — B009 Herpesviral infection, unspecified: Secondary | ICD-10-CM | POA: Diagnosis not present

## 2021-06-17 DIAGNOSIS — Z01419 Encounter for gynecological examination (general) (routine) without abnormal findings: Secondary | ICD-10-CM

## 2021-06-17 DIAGNOSIS — Z780 Asymptomatic menopausal state: Secondary | ICD-10-CM | POA: Diagnosis not present

## 2021-06-17 MED ORDER — VALACYCLOVIR HCL 500 MG PO TABS: ORAL_TABLET | ORAL | 3 refills | Status: DC

## 2021-06-17 NOTE — Progress Notes (Signed)
Christina [name] [birth year] [id number]   History:  65 y.o. G4P0014 presents for annual exam. Postmenopausal - No HRT. S/P 2006 TVH for DUB. Abnormal pap without intervention years ago, normal since. HSV on buttocks, she has been taking as needed but has been having more outbreaks.  HTN, Vitamin B12 deficiency, anxiety/depression managed by PCP.   Gynecologic History No LMP recorded. Patient has had a hysterectomy.   Contraception/Family planning: status post hysterectomy Sexually active: Yes  Health Maintenance Last Pap: 03/25/2013. Results were: Normal Last mammogram: 09/10/2020. Results were: Normal Last colonoscopy: 12/09/2017. Results were: benign polyp, 5-year recall Last Dexa: 2016. Results were: T-score -2.2, FRAX 9.9% / 1.3%  Past medical history, past surgical history, family history and social history were all reviewed and documented in the EPIC chart. Married. 1 son, 3 daughters, all live local. 4 grandsons ages 61-8. 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   ROS:  A ROS was performed and pertinent positives and negatives are included.  Exam:  Vitals:   06/17/21 1205  BP: 124/82  Weight: 171 lb (77.6 kg)  Height: 5\' 4"  (1.626 m)   Body mass index is 29.35 kg/m.  General appearance:  Normal Thyroid:  Symmetrical, normal in size, without palpable masses or nodularity. Respiratory  Auscultation:  Clear without wheezing or rhonchi Cardiovascular  Auscultation:  Regular rate, without rubs, murmurs or gallops  Edema/varicosities:  Not grossly evident Abdominal  Soft,nontender, without masses, guarding or rebound.  Liver/spleen:  No organomegaly noted  Hernia:  None appreciated  Skin  Inspection:  Grossly normal Breasts: Examined lying and sitting.   Right: Without masses, retractions, nipple discharge or axillary adenopathy.   Left: Without masses, retractions, nipple discharge or axillary adenopathy. Genitourinary   Inguinal/mons:  Normal without inguinal adenopathy  External  genitalia:  Normal appearing vulva with no masses, tenderness, or lesions  BUS/Urethra/Skene's glands:  Normal  Vagina:  Normal appearing with normal color and discharge, no lesions  Cervix:  Absent  Uterus:  Absent  Adnexa/parametria:     Rt: Normal in size, without masses or tenderness.   Lt: Normal in size, without masses or tenderness.  Anus and perineum: Normal  Digital rectal exam: Normal sphincter tone without palpated masses or tenderness  Patient informed chaperone available to be present for breast and pelvic exam. Patient has requested no chaperone to be present. Patient has been advised what will be completed during breast and pelvic exam.   Assessment/Plan:  65 y.o. 78 for annual exam.   Well female exam with routine gynecological exam - Education provided on SBEs, importance of preventative screenings, current guidelines, high calcium diet, regular exercise, and multivitamin daily. Labs with PCP.   Postmenopausal - Plan: DG Bone Density. No HRT. S/P 2006 TVH for DUB.   Osteopenia of multiple sites - Plan: DG Bone Density. T-score -2.2 without elevated FRAX in 2016. Will repeat DXA now. Continue Vitamin D supplement. Add in Calcium supplement or multivitamin and increase exercise. She plans to rejoin gym soon.   HSV (herpes simplex virus) infection - Plan: valACYclovir (VALTREX) 500 MG tablet daily. Has been having more outbreaks. Recommend taking daily.   Screening for cervical cancer - Abnormal pap without intervention years ago, normal since. No longer screening per guidelines.   Screening for breast cancer - Normal mammogram history.  Continue annual screenings.  Normal breast exam today.  Screening for colon cancer - 2019 colonoscopy - benign polyps. Will repeat at 5-year interval per GI's recommendation.   Return in 1  year for annual.   Olivia Mackie DNP, 12:30 PM 06/17/2021

## 2021-07-30 ENCOUNTER — Other Ambulatory Visit: Payer: Self-pay | Admitting: Nurse Practitioner

## 2021-07-30 ENCOUNTER — Ambulatory Visit (INDEPENDENT_AMBULATORY_CARE_PROVIDER_SITE_OTHER): Payer: Managed Care, Other (non HMO)

## 2021-07-30 ENCOUNTER — Other Ambulatory Visit: Payer: Self-pay

## 2021-07-30 DIAGNOSIS — Z780 Asymptomatic menopausal state: Secondary | ICD-10-CM

## 2021-07-30 DIAGNOSIS — M810 Age-related osteoporosis without current pathological fracture: Secondary | ICD-10-CM | POA: Diagnosis not present

## 2021-07-30 DIAGNOSIS — M8589 Other specified disorders of bone density and structure, multiple sites: Secondary | ICD-10-CM

## 2021-09-17 ENCOUNTER — Encounter: Payer: Self-pay | Admitting: Nurse Practitioner

## 2021-09-17 NOTE — Telephone Encounter (Signed)
Christina [name], ?I already sent message high priority to front desk to call her and arrange breast exam with Clearnce Hasten, NP for tomorrow. ?

## 2021-09-18 ENCOUNTER — Other Ambulatory Visit: Payer: Self-pay

## 2021-09-18 ENCOUNTER — Ambulatory Visit: Payer: Managed Care, Other (non HMO) | Admitting: Radiology

## 2021-09-18 ENCOUNTER — Telehealth: Payer: Self-pay | Admitting: *Deleted

## 2021-09-18 ENCOUNTER — Encounter: Payer: Self-pay | Admitting: Radiology

## 2021-09-18 VITALS — BP 184/98

## 2021-09-18 DIAGNOSIS — N644 Mastodynia: Secondary | ICD-10-CM

## 2021-09-18 NOTE — Progress Notes (Signed)
? ?  Christina [name] 01/03/1956 [id number] ? ? ?History:  66 y.o. G4P4 presents with complaints of breast pain/burning under right areola/nipple x 2 weeks. Denies any injury to the area, no increase in caffeine. No palpable mass. ? ?Gynecologic History ?No LMP recorded. Patient has had a hysterectomy. ?  ? ?Last mammogram: 3/22. Results were: normal ? ?Obstetric History ?OB History  ?Gravida Para Term Preterm AB Living  ?4 3     1 4   ?SAB IAB Ectopic Multiple Live Births  ?           ?  ?# Outcome Date GA Lbr Len/2nd Weight Sex Delivery Anes PTL Lv  ?4 Para           ?3 AB           ?2 Para           ?1 Para           ? ? ? ?The following portions of the patient's history were reviewed and updated as appropriate: allergies, current medications, past family history, past medical history, past social history, past surgical history, and problem list. ? ?Review of Systems ?Pertinent items noted in HPI and remainder of comprehensive ROS otherwise negative.  ? ?Past medical history, past surgical history, family history and social history were all reviewed and documented in the EPIC chart. ? ? ?Exam: ? ?Vitals:  ? 09/18/21 1351  ?BP: (!) 184/98  ? ?There is no height or weight on file to calculate BMI. ? ?General appearance:  Normal ?Thyroid:  Symmetrical, normal in size, without palpable masses or nodularity. ?Respiratory ? Auscultation:  Clear without wheezing or rhonchi ?Cardiovascular ? Auscultation:  Regular rate, without rubs, murmurs or gallops ? Edema/varicosities:  Not grossly evident ?Breasts: breasts appear normal, no suspicious masses, no skin or nipple changes or axillary nodes. Right breast tender with palpation in the area of the areola ? ?Patient informed chaperone available to be present for breast exam. Patient has requested no chaperone to be present.  ? ?Assessment/Plan:   ? ?Breast pain in female  ?Keep appt schedule for mammogram- change to diagnostic right, screening left ? ?09/20/21 B WHNP-BC  2:06 PM 09/18/2021  ?

## 2021-09-18 NOTE — Telephone Encounter (Signed)
I called [facility] and patient is scheduled for diagnostic mammogram. I put order for bilateral diagnostic mammogram and right breast ultrasound. Order faxed  ?

## 2021-09-18 NOTE — Telephone Encounter (Signed)
-----   Message from Tanda Rockers, NP sent at 09/18/2021  2:03 PM EDT ----- ?Regarding: Mammogram ?Patient scheduled tomorrow for mammo. Please confirm it is diagnostic. Having burning pain under right nipple/areola ? ?

## 2021-09-20 ENCOUNTER — Encounter: Payer: Self-pay | Admitting: Nurse Practitioner

## 2021-09-30 ENCOUNTER — Telehealth: Payer: Self-pay

## 2021-09-30 ENCOUNTER — Ambulatory Visit
Admission: RE | Admit: 2021-09-30 | Discharge: 2021-09-30 | Disposition: A | Discharge status: Home / Self Care | Payer: Commercial Managed Care - HMO | Source: Ambulatory Visit | Attending: Family | Admitting: Family

## 2021-09-30 ENCOUNTER — Encounter: Payer: Self-pay | Admitting: Family

## 2021-09-30 ENCOUNTER — Ambulatory Visit (INDEPENDENT_AMBULATORY_CARE_PROVIDER_SITE_OTHER)
Admission: RE | Admit: 2021-09-30 | Discharge: 2021-09-30 | Disposition: A | Discharge status: Home / Self Care | Payer: Managed Care, Other (non HMO) | Source: Ambulatory Visit | Attending: Family | Admitting: Family

## 2021-09-30 ENCOUNTER — Other Ambulatory Visit: Payer: Self-pay

## 2021-09-30 ENCOUNTER — Ambulatory Visit (INDEPENDENT_AMBULATORY_CARE_PROVIDER_SITE_OTHER): Payer: Managed Care, Other (non HMO) | Admitting: Family

## 2021-09-30 VITALS — BP 158/98 | HR 69 | Temp 98.9°F | Resp 16 | Ht 64.0 in | Wt 174.1 lb

## 2021-09-30 DIAGNOSIS — M5442 Lumbago with sciatica, left side: Secondary | ICD-10-CM | POA: Insufficient documentation

## 2021-09-30 DIAGNOSIS — M79662 Pain in left lower leg: Secondary | ICD-10-CM | POA: Diagnosis present

## 2021-09-30 HISTORY — DX: Lumbago with sciatica, left side: M54.42

## 2021-09-30 MED ORDER — PREDNISONE 20 MG PO TABS: ORAL_TABLET | ORAL | 0 refills | Status: DC

## 2021-09-30 NOTE — Telephone Encounter (Signed)
Called and informed pt. She said she understood . ?

## 2021-09-30 NOTE — Patient Instructions (Addendum)
A referral was placed today for physical therapy.  ?Please let us know if you have not heard back within 1 week about your referral. ? ?You can take no more than 800 mg ibuprofen every eight hours.  ?Take no more than 1000 mg tylenol (acetaminophen( every eight hours) ? ?Complete xray(s) prior to leaving today. I will notify you of your results once received. ? ?It was a pleasure seeing you today! Please do not hesitate to reach out with any questions and or concerns. ? ?Regards,  ? ?Lenee Franze ?FNP-C ? ? ?

## 2021-09-30 NOTE — Assessment & Plan Note (Addendum)
rx prednisone 20 mg taper ?Ibuprofen/tylenol prn ?Heat to site ?Handout given for exercises ?Referral for physical therapy ?Consider referral neurosurgery pending results ?Low back xray ordered pending results ?

## 2021-09-30 NOTE — Assessment & Plan Note (Signed)
Stat venous doppler u/s left lower leg r/o dvt ?If cp palp sob go to er call 911 ?

## 2021-09-30 NOTE — Telephone Encounter (Signed)
Parker at [facility] [place] calling report on pt who is still waiting. Parker request cb at [phone]. [place] neg for DVT; [place] report is in epic. Sending note to T Dugal FNP and Tresa Endo CMA and will teams both since pt is waiting. ?

## 2021-09-30 NOTE — Telephone Encounter (Signed)
Christina Sawyers FNP teams me and said if [place] negative for DVT pt can leave. [facility] notified and she will let pt know.Christina [name] said pt would wait for phone call from [facility] at home.  Sending ntoe to T Dugal FNP and Tresa Endo CMA. ?

## 2021-09-30 NOTE — Progress Notes (Signed)
? ?Established Patient Office Visit ? ?Subjective:  ?Patient ID: Christina [name], female    DOB: May 14, 1956  Age: 66 y.o. MRN: [id number] ? ?CC:  ?Chief Complaint  ?Patient presents with  ? Back Pain  ?  Left side X 4. 5 weeks lower back all the way to foot. Numb and tingling.  ? ? ?HPI ?Christina [name] is here today with concerns.  ? ?About one month ago started with lower back pain, and has since progressed to left sided back pain that radiates pain down the foot with numbness and tingling in the foot. She tried some heat and 'babied' it and no real improvement. Back of leg/calf on left lower extremity cramping as well.  ? ?The numbness sensation has been for the last one week on and off.  ?Using ibuprofen over the counter, taking 600 mg about every four hours.  ?Also taking excedrin as needed as well.  ?Not taking tylenol as no relief.  ?Does tolerate steroid in the past.  ?  ? ?Past Medical History:  ?Diagnosis Date  ? Depression   ? Dry mouth 07/04/2020  ? GAD (generalized anxiety disorder)   ? Genital herpes   ? Nausea vomiting and diarrhea 01/09/2020  ? Osteoarthritis   ? Seasonal allergies   ? ? ?Past Surgical History:  ?Procedure Laterality Date  ? CARPAL TUNNEL RELEASE Bilateral 05/21/2013  ? BILATERAL CARPAL TUNNEL SURGERY  ? CERVICAL FUSION    ? CESAREAN SECTION  1990  ? CESAREAN SECTION  1992  ? VAGINAL HYSTERECTOMY    ? ? ?Family History  ?Problem Relation Age of Onset  ? Cancer Mother   ?     brain  ? Ovarian cancer Mother   ? Cancer Father   ?     prostate  ? Breast cancer Maternal Grandmother   ? Ovarian cancer Maternal Grandmother   ? Heart Problems Brother   ? Healthy Daughter   ? Healthy Daughter   ? Healthy Son   ? Healthy Son   ? ? ?Social History  ? ?Socioeconomic History  ? Marital status: Married  ?  Spouse name: Not on file  ? Number of children: Not on file  ? Years of education: Not on file  ? Highest education level: Not on file  ?Occupational History  ? Not on file  ?Tobacco Use  ? Smoking status:  Never  ? Smokeless tobacco: Never  ?Vaping Use  ? Vaping Use: Never used  ?Substance and Sexual Activity  ? Alcohol use: Yes  ?  Comment: socially  ? Drug use: No  ? Sexual activity: Not Currently  ?  Birth control/protection: Surgical  ?  Comment: FEB. 1980, 4 PARTNERS.  ?Other Topics Concern  ? Not on file  ?Social History Narrative  ? Married.  ? 2 children.  ? Works as Arts development officer.   ? ?Social Determinants of Health  ? ?Financial Resource Strain: Not on file  ?Food Insecurity: Not on file  ?Transportation Needs: Not on file  ?Physical Activity: Not on file  ?Stress: Not on file  ?Social Connections: Not on file  ?Intimate Partner Violence: Not on file  ? ? ?Outpatient Medications Prior to Visit  ?Medication Sig Dispense Refill  ? acyclovir ointment (ZOVIRAX) 5 % APPLY 1 APPLICATION TOPICALLY EVERY 3 HOURS. 30 g 2  ? buprenorphine-naloxone (SUBOXONE) 8-2 mg SUBL SL tablet Place 1 tablet under the tongue daily as needed.    ? DULoxetine (CYMBALTA) 30 MG capsule Take  1 capsule (30 mg total) by mouth daily. For anxiety and depression. 90 capsule 3  ? lisinopril (ZESTRIL) 20 MG tablet TAKE 1 TABLET (20 MG TOTAL) BY MOUTH DAILY. FOR BLOOD PRESSURE 90 tablet 2  ? ondansetron (ZOFRAN ODT) 4 MG disintegrating tablet Take 1 tablet (4 mg total) by mouth every 8 (eight) hours as needed for nausea or vomiting. 15 tablet 0  ? valACYclovir (VALTREX) 500 MG tablet TAKE 1 TABLET BY MOUTH TWICE A DAY FOR 3 TO 5 DAYS FOR ONSET OUTBREAK , THEN DAILY AS NEEDED. 90 tablet 3  ? ?No facility-administered medications prior to visit.  ? ? ?Allergies  ?Allergen Reactions  ? Toradol [Ketorolac Tromethamine] Nausea And Vomiting  ? Ultram [Tramadol] Nausea And Vomiting  ? ? ?ROS ?Review of Systems  ?Constitutional:  Negative for chills, fatigue, fever and unexpected weight change.  ?Eyes:  Negative for visual disturbance.  ?Respiratory:  Negative for shortness of breath.   ?Cardiovascular:  Negative for chest pain, palpitations and leg  swelling.  ?Gastrointestinal:  Negative for abdominal pain.  ?Genitourinary:  Negative for difficulty urinating.  ?Musculoskeletal:  Positive for back pain (left lower back pain/buttock pain with radiation of pain down left posterior leg to base of foot). Negative for joint swelling.  ?     Left calf pain with tenderness on palpation  ?Skin:  Negative for rash.  ?Neurological:  Negative for dizziness and headaches.  ? ?  ?Objective:  ?  ?Physical Exam ?Vitals reviewed.  ?Constitutional:   ?   General: She is not in acute distress. ?   Appearance: Normal appearance. She is not ill-appearing or toxic-appearing.  ?HENT:  ?   Mouth/Throat:  ?   Pharynx: No pharyngeal swelling.  ?   Tonsils: No tonsillar exudate.  ?Neck:  ?   Thyroid: No thyroid mass.  ?Cardiovascular:  ?   Rate and Rhythm: Normal rate and regular rhythm.  ?Pulmonary:  ?   Effort: Pulmonary effort is normal.  ?   Breath sounds: Normal breath sounds.  ?Musculoskeletal:     ?   General: Tenderness (left sciatic notch tender on palpation) present.  ?   Lumbar back: Edema (left buttock) and spasms (left sciatic notch) present. Decreased range of motion (painful with all rotation). Positive left straight leg raise test.  ?   Comments: + homans left lower leg   ?Lymphadenopathy:  ?   Cervical:  ?   Right cervical: No superficial cervical adenopathy. ?   Left cervical: No superficial cervical adenopathy.  ?Skin: ?   General: Skin is warm.  ?   Capillary Refill: Capillary refill takes less than 2 seconds.  ?Neurological:  ?   General: No focal deficit present.  ?   Mental Status: She is alert and oriented to person, place, and time.  ?Psychiatric:     ?   Mood and Affect: Mood normal.     ?   Behavior: Behavior normal.     ?   Thought Content: Thought content normal.     ?   Judgment: Judgment normal.  ? ? ?BP (!) 158/98   Pulse 69   Temp 98.9 ?F (37.2 ?C)   Resp 16   Ht 5\' 4"  (1.626 m)   Wt 174 lb 2 oz (79 kg)   SpO2 96%   BMI 29.89 kg/m?  ?Wt Readings  from Last 3 Encounters:  ?09/30/21 174 lb 2 oz (79 kg)  ?06/17/21 171 lb (77.6 kg)  ?03/14/21 156 lb (  70.8 kg)  ? ? ? ?Health Maintenance Due  ?Topic Date Due  ? HIV Screening  Never done  ? TETANUS/TDAP  Never done  ? Zoster Vaccines- Shingrix (1 of 2) Never done  ? PAP SMEAR-Modifier  03/25/2016  ? COVID-19 Vaccine (3 - Booster for Pfizer series) 12/12/2019  ? ? ?There are no preventive care reminders to display for this patient. ? ?Lab Results  ?Component Value Date  ? TSH 1.42 07/04/2020  ? ?Lab Results  ?Component Value Date  ? WBC 7.1 07/04/2020  ? HGB 14.7 07/04/2020  ? HCT 43.2 07/04/2020  ? MCV 95.7 07/04/2020  ? PLT 298.0 07/04/2020  ? ?Lab Results  ?Component Value Date  ? NA 140 03/14/2021  ? K 4.9 03/14/2021  ? CO2 30 03/14/2021  ? GLUCOSE 87 03/14/2021  ? BUN 18 03/14/2021  ? CREATININE 0.76 03/14/2021  ? BILITOT 0.4 03/14/2021  ? ALKPHOS 77 03/14/2021  ? AST 20 03/14/2021  ? ALT 16 03/14/2021  ? PROT 7.0 03/14/2021  ? ALBUMIN 4.0 03/14/2021  ? CALCIUM 9.4 03/14/2021  ? ANIONGAP 12 03/26/2019  ? GFR 83.14 03/14/2021  ? ?Lab Results  ?Component Value Date  ? HGBA1C 5.4 07/05/2020  ? ? ?  ?Assessment & Plan:  ? ?Problem List Items Addressed This Visit   ? ?  ? Nervous and Auditory  ? Acute left-sided low back pain with left-sided sciatica - Primary  ?  rx prednisone 20 mg taper ?Ibuprofen/tylenol prn ?Heat to site ?Handout given for exercises ?Referral for physical therapy ?Consider referral neurosurgery pending results ?Low back xray ordered pending results ?  ?  ? Relevant Medications  ? predniSONE (DELTASONE) 20 MG tablet  ? Other Relevant Orders  ? DG Lumbar Spine Complete  ? Ambulatory referral to Physical Therapy  ?  ? Other  ? Pain of left calf  ?  Stat venous doppler u/s left lower leg r/o dvt ?If cp palp sob go to er call 911 ?  ?  ? Relevant Orders  ? US Venous Img Lower Unilateral Left (DVT)  ? ? ?Meds ordered this encounter  ?Medications  ? predniSONE (DELTASONE) 20 MG tablet  ?  Sig: Take  two tablets daily for 3 days followed by one tablet daily for 4 days  ?  Dispense:  10 tablet  ?  Refill:  0  ?  Order Specific Question:   Supervising Provider  ?  Answer:   BEDSOLE, AMY E [[id number]]  ? ? ?Follow-up: No

## 2021-10-01 ENCOUNTER — Telehealth: Payer: Self-pay

## 2021-10-01 ENCOUNTER — Other Ambulatory Visit: Payer: Self-pay | Admitting: Family

## 2021-10-01 DIAGNOSIS — M47816 Spondylosis without myelopathy or radiculopathy, lumbar region: Secondary | ICD-10-CM | POA: Insufficient documentation

## 2021-10-01 DIAGNOSIS — M4726 Other spondylosis with radiculopathy, lumbar region: Secondary | ICD-10-CM

## 2021-10-01 NOTE — Telephone Encounter (Signed)
I gave pt her Spine Xray results. She already has a Midwife at [facility]. His name is Noralee Stain. His office is in [place]. Needs the referral appointment to go to him. ?

## 2021-10-01 NOTE — Progress Notes (Signed)
Osteoarthritis seen in the lower spine as well as facet hypertrophy (possible pinched nerve) ?Sending referral to neurosurgeon for pt to make appt.have pt let us know if no response in next 1-2 weeks in regards to referral

## 2021-10-01 NOTE — Telephone Encounter (Signed)
Left message to return call to our office.  

## 2021-10-01 NOTE — Telephone Encounter (Signed)
Pt was given results.  

## 2021-10-01 NOTE — Telephone Encounter (Signed)
Called and informed pt about the results. ?

## 2021-10-02 ENCOUNTER — Other Ambulatory Visit: Payer: Self-pay | Admitting: Family

## 2021-10-02 DIAGNOSIS — M4726 Other spondylosis with radiculopathy, lumbar region: Secondary | ICD-10-CM

## 2021-10-02 DIAGNOSIS — M5442 Lumbago with sciatica, left side: Secondary | ICD-10-CM

## 2021-10-02 DIAGNOSIS — M47816 Spondylosis without myelopathy or radiculopathy, lumbar region: Secondary | ICD-10-CM

## 2021-10-15 ENCOUNTER — Encounter: Payer: Self-pay | Admitting: *Deleted

## 2022-07-02 ENCOUNTER — Telehealth: Payer: Self-pay | Admitting: Primary Care

## 2022-07-02 ENCOUNTER — Ambulatory Visit (INDEPENDENT_AMBULATORY_CARE_PROVIDER_SITE_OTHER): Payer: Medicare Other | Admitting: Primary Care

## 2022-07-02 ENCOUNTER — Encounter: Payer: Self-pay | Admitting: Primary Care

## 2022-07-02 VITALS — BP 156/98 | HR 82 | Temp 97.1°F | Ht 64.0 in | Wt 180.0 lb

## 2022-07-02 DIAGNOSIS — G8929 Other chronic pain: Secondary | ICD-10-CM

## 2022-07-02 DIAGNOSIS — B009 Herpesviral infection, unspecified: Secondary | ICD-10-CM

## 2022-07-02 DIAGNOSIS — M4726 Other spondylosis with radiculopathy, lumbar region: Secondary | ICD-10-CM

## 2022-07-02 DIAGNOSIS — I10 Essential (primary) hypertension: Secondary | ICD-10-CM

## 2022-07-02 DIAGNOSIS — M542 Cervicalgia: Secondary | ICD-10-CM

## 2022-07-02 DIAGNOSIS — M47816 Spondylosis without myelopathy or radiculopathy, lumbar region: Secondary | ICD-10-CM | POA: Diagnosis not present

## 2022-07-02 DIAGNOSIS — F419 Anxiety disorder, unspecified: Secondary | ICD-10-CM

## 2022-07-02 DIAGNOSIS — M545 Low back pain, unspecified: Secondary | ICD-10-CM

## 2022-07-02 DIAGNOSIS — F32A Depression, unspecified: Secondary | ICD-10-CM

## 2022-07-02 MED ORDER — LISINOPRIL-HYDROCHLOROTHIAZIDE 20-12.5 MG PO TABS: 1.0000 | ORAL_TABLET | Freq: Every day | ORAL | 0 refills | Status: DC

## 2022-07-02 NOTE — Assessment & Plan Note (Signed)
Above goal today, also during prior visits.  Stop lisinopril 20 mg. Start lisinopril-HCTZ 20-12.5 mg daily.  We will plan to see her back in 2-3 weeks for BP check and BMP.

## 2022-07-02 NOTE — Telephone Encounter (Signed)
Noted, Rx sent to [facility] pharmacy. 

## 2022-07-02 NOTE — Assessment & Plan Note (Signed)
Following with GYN. Continue Valtrex 500 mg daily.
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
## 2022-07-02 NOTE — Assessment & Plan Note (Signed)
Referral placed to pain management for Suboxone management. 

## 2022-07-02 NOTE — Patient Instructions (Signed)
Stop lisinopril 20 mg daily for blood pressure.  Start lisinopril-hydrochlorothiazide 20-12.5 mg once daily for blood pressure.  You will either be contacted via phone regarding your referral to pain management, or you may receive a letter on your MyChart portal from our referral team with instructions for scheduling an appointment. Please let us know if you have not been contacted by anyone within two weeks.  Please schedule a follow up visit to meet back with me in 2-3 weeks for blood pressure check.   It was a pleasure to see you today!

## 2022-07-02 NOTE — Telephone Encounter (Signed)
Patient would like medication lisinopril-hydrochlorothiazide (ZESTORETIC) 20-12.5 MG tablet  sent to  [id number] [place], [facility] AT [address] Phone: [phone]  Fax: [phone]     She forgot to let Anda Kraft know that she changed pharmacies when she was here for her appointment today.

## 2022-07-02 NOTE — Assessment & Plan Note (Signed)
Controlled. Remain off Cymbalta 30 mg daily. Continue to monitor

## 2022-07-02 NOTE — Assessment & Plan Note (Signed)
Referral placed to pain management for Suboxone management.

## 2022-07-10 ENCOUNTER — Encounter: Payer: Self-pay | Admitting: Family Medicine

## 2022-07-10 ENCOUNTER — Ambulatory Visit (INDEPENDENT_AMBULATORY_CARE_PROVIDER_SITE_OTHER): Payer: Medicare Other | Admitting: Family Medicine

## 2022-07-10 VITALS — BP 110/80 | HR 78 | Temp 99.1°F | Ht 64.0 in | Wt 180.1 lb

## 2022-07-10 DIAGNOSIS — R051 Acute cough: Secondary | ICD-10-CM

## 2022-07-10 DIAGNOSIS — U071 COVID-19: Secondary | ICD-10-CM | POA: Diagnosis not present

## 2022-07-10 LAB — POC COVID19 BINAXNOW: SARS Coronavirus 2 Ag: POSITIVE — AB

## 2022-07-10 MED ORDER — NIRMATRELVIR/RITONAVIR (PAXLOVID)TABLET: 3.0000 | ORAL_TABLET | Freq: Two times a day (BID) | ORAL | 0 refills | Status: AC

## 2022-07-10 NOTE — Progress Notes (Signed)
Holland Kotter T. Jearldine Cassady, MD, [place] [facility] [place], [address]  Phone: [phone]  FAX: [phone]  ANESHIA JACQUET - 67 y.o. female  MRN [id number]  Date of Birth: Jul 16, 1955  Date: 07/10/2022  PCP: Pleas Koch, NP  Referral: Pleas Koch, NP  Chief Complaint  Patient presents with   Covid Exposure    Symptoms started during the night last night   Sore Throat   Cough   Fatigue   Subjective:   GLENDELL SCHLOTTMAN is a 67 y.o. very pleasant female patient with Body mass index is 30.92 kg/m. who presents with the following:  Patient presents with cough, congestion, sore throat.  She requested COVID-19 test today.  Dad broke his hip, 79 fout and he is in a nursing home.  Mom got covid on Tuesday, then Dad, now her brother.  She has had very close contact with them daily.  Started over night.  Last night she started to feel quite ill, she has a relatively severe sore throat, cough, arthralgia and very significant fatigue. Only been around family.   Temperature is 99.1 today in the office.  Achy this morning Dad not have a fever  Eating ok, trying to push fluids.  Sore throat.     Review of Systems is noted in the HPI, as appropriate  Objective:   BP 110/80   Pulse 78   Temp 99.1 F (37.3 C) (Oral)   Ht 5\' 4"  (1.626 m)   Wt 180 lb 2 oz (81.7 kg)   SpO2 95%   BMI 30.92 kg/m    Gen: WDWN, NAD. Globally Non-toxic HEENT: Throat clear, w/o exudate, R TM clear, L TM - good landmarks, No fluid present. rhinnorhea.  MMM Frontal sinuses: NT Max sinuses: NT NECK: Anterior cervical  LAD is absent CV: RRR, No M/G/R, cap refill <2 sec PULM: Breathing comfortably in no respiratory distress. no wheezing, crackles, rhonchi   Laboratory and Imaging Data:  Assessment and Plan:     ICD-10-CM   1. COVID-19  U07.1     2. Acute cough  R05.1 POC COVID-19     Acute COVID-19.  We will treat  as such and place her on Paxlovid.  She understands the quarantine rules, and she will quarantine for 5 days.  After that she will wear a mask.  Medication Management during today's office visit: Meds ordered this encounter  Medications   nirmatrelvir/ritonavir (PAXLOVID) 20 x 150 MG & 10 x 100MG  TABS    Sig: Take 3 tablets by mouth 2 (two) times daily for 5 days. (Take nirmatrelvir 150 mg two tablets twice daily for 5 days and ritonavir 100 mg one tablet twice daily for 5 days) Patient GFR is 83    Dispense:  30 tablet    Refill:  0   There are no discontinued medications.  Orders placed today for conditions managed today: Orders Placed This Encounter  Procedures   POC COVID-19    Disposition: No follow-ups on file.  Dr[employer] One speech-to-text software was used for transcription in this dictation.  Possible transcriptional errors can occur using Editor, commissioning.   Signed,  Maud Deed. Shameika Speelman, MD   Outpatient Encounter Medications as of 07/10/2022  Medication Sig   acyclovir ointment (ZOVIRAX) 5 % APPLY 1 APPLICATION TOPICALLY EVERY 3 HOURS.   buprenorphine-naloxone (SUBOXONE) 8-2 mg SUBL SL tablet Place 1 tablet under the tongue daily as needed.  lisinopril-hydrochlorothiazide (ZESTORETIC) 20-12.5 MG tablet Take 1 tablet by mouth daily. for blood pressure.   nirmatrelvir/ritonavir (PAXLOVID) 20 x 150 MG & 10 x 100MG  TABS Take 3 tablets by mouth 2 (two) times daily for 5 days. (Take nirmatrelvir 150 mg two tablets twice daily for 5 days and ritonavir 100 mg one tablet twice daily for 5 days) Patient GFR is 83   ondansetron (ZOFRAN ODT) 4 MG disintegrating tablet Take 1 tablet (4 mg total) by mouth every 8 (eight) hours as needed for nausea or vomiting.   valACYclovir (VALTREX) 500 MG tablet TAKE 1 TABLET BY MOUTH TWICE A DAY FOR 3 TO 5 DAYS FOR ONSET OUTBREAK , THEN DAILY AS NEEDED.   No facility-administered encounter medications on file as of 07/10/2022.

## 2022-07-11 ENCOUNTER — Encounter: Payer: Self-pay | Admitting: Family Medicine

## 2022-07-23 ENCOUNTER — Encounter: Payer: Self-pay | Admitting: Primary Care

## 2022-07-23 ENCOUNTER — Ambulatory Visit (INDEPENDENT_AMBULATORY_CARE_PROVIDER_SITE_OTHER): Payer: Medicare Other | Admitting: Primary Care

## 2022-07-23 VITALS — BP 138/82 | HR 64 | Temp 98.0°F | Wt 179.0 lb

## 2022-07-23 DIAGNOSIS — I10 Essential (primary) hypertension: Secondary | ICD-10-CM

## 2022-07-23 DIAGNOSIS — M47816 Spondylosis without myelopathy or radiculopathy, lumbar region: Secondary | ICD-10-CM | POA: Diagnosis not present

## 2022-07-23 DIAGNOSIS — Z5181 Encounter for therapeutic drug level monitoring: Secondary | ICD-10-CM

## 2022-07-23 DIAGNOSIS — B009 Herpesviral infection, unspecified: Secondary | ICD-10-CM

## 2022-07-23 DIAGNOSIS — Z79899 Other long term (current) drug therapy: Secondary | ICD-10-CM

## 2022-07-23 LAB — COMPREHENSIVE METABOLIC PANEL
ALT: 22 U/L (ref 0–35)
AST: 24 U/L (ref 0–37)
Albumin: 4.3 g/dL (ref 3.5–5.2)
Alkaline Phosphatase: 59 U/L (ref 39–117)
BUN: 9 mg/dL (ref 6–23)
CO2: 30 mEq/L (ref 19–32)
Calcium: 9.5 mg/dL (ref 8.4–10.5)
Chloride: 88 mEq/L — ABNORMAL LOW (ref 96–112)
Creatinine, Ser: 0.77 mg/dL (ref 0.40–1.20)
GFR: 81.07 mL/min (ref >60.00)
Glucose, Bld: 117 mg/dL — ABNORMAL HIGH (ref 70–99)
Potassium: 5.4 mEq/L — ABNORMAL HIGH (ref 3.5–5.1)
Sodium: 124 mEq/L — ABNORMAL LOW (ref 135–145)
Total Bilirubin: 0.6 mg/dL (ref 0.2–1.2)
Total Protein: 7.2 g/dL (ref 6.0–8.3)

## 2022-07-23 MED ORDER — VALACYCLOVIR HCL 500 MG PO TABS: ORAL_TABLET | ORAL | 0 refills | Status: DC

## 2022-07-23 NOTE — Patient Instructions (Signed)
Stop by the lab prior to leaving today. I will notify you of your results once received.   Start Valtrex as discussed today. Continue once daily for prevention for now.   It was a pleasure to see you today!

## 2022-07-23 NOTE — Assessment & Plan Note (Signed)
After further chart review, it appears that pain management rejected our referral as they do not managed Suboxone. We were not notified.  New referral placed. Discussed with patient today.

## 2022-07-23 NOTE — Progress Notes (Signed)
Subjective:    Patient ID: Christina [name], female    DOB: 1956/01/21, 67 y.o.   MRN: [id number]  HPI  Christina [name] is a very pleasant 67 y.o. female with a history of hypertension, IBS, chronic pain, anxiety/depression who presents today for follow up of hypertension. She also wants to discuss herpes outbreak.  She has also not heard back regarding her referral to the [facility].  1) Hypertension: She was last evaluated by me on 07/02/22 for general follow up when her BP was noted to be recurrently elevated despite management on lisinopril 20 mg. During this visit we stopped lisinopril 20 mg and changed her to lisinopril-HCTZ 20-12.5 mg. She is here for follow up today.  Since her last visit she is compliant to lisinopril-HCTZ 20-12.5 mg. She has been checking her BP at home which is running 120's-130's/70's. She denies headaches, dizziness.   2) HSV: Currently managed on Valtrex 500 mg PRN for genital herpes. Outbreaks typically occur with increased stress. Her recent flare began last night as she's recently been under a lot of personal stress. She took 1500 mg last night. She will sometimes take her Valtrex daily during stressful times to prevent outbreaks.   BP Readings from Last 3 Encounters:  07/23/22 138/82  07/10/22 110/80  07/02/22 (!) 156/98     Review of Systems  Respiratory:  Negative for shortness of breath.   Cardiovascular:  Negative for chest pain.  Genitourinary:        Genital herpes  Neurological:  Negative for dizziness and headaches.         Past Medical History:  Diagnosis Date   Acute left-sided low back pain with left-sided sciatica 09/30/2021   Depression    Dry mouth 07/04/2020   GAD (generalized anxiety disorder)    Genital herpes    Nausea vomiting and diarrhea 01/09/2020   Osteoarthritis    Seasonal allergies     Social History   Socioeconomic History   Marital status: Married    Spouse name: Not on file   Number of children: Not on  file   Years of education: Not on file   Highest education level: Not on file  Occupational History   Not on file  Tobacco Use   Smoking status: Never   Smokeless tobacco: Never  Vaping Use   Vaping Use: Never used  Substance and Sexual Activity   Alcohol use: Yes    Comment: socially   Drug use: No   Sexual activity: Not Currently    Birth control/protection: Surgical    Comment: FEB. 1980, 4 PARTNERS.  Other Topics Concern   Not on file  Social History Narrative   Married.   2 children.   Works as Materials engineer.    Social Determinants of Health   Financial Resource Strain: Not on file  Food Insecurity: Not on file  Transportation Needs: Not on file  Physical Activity: Not on file  Stress: Not on file  Social Connections: Not on file  Intimate Partner Violence: Not on file    Past Surgical History:  Procedure Laterality Date   CARPAL TUNNEL RELEASE Bilateral 05/21/2013   BILATERAL CARPAL TUNNEL SURGERY   CERVICAL FUSION     CESAREAN SECTION  1990   CESAREAN SECTION  1992   VAGINAL HYSTERECTOMY      Family History  Problem Relation Age of Onset   Cancer Mother        brain   Ovarian cancer Mother  Cancer Father        prostate   Breast cancer Maternal Grandmother    Ovarian cancer Maternal Grandmother    Heart Problems Brother    Healthy Daughter    Healthy Daughter    Healthy Son    Healthy Son     Allergies  Allergen Reactions   Toradol [Ketorolac Tromethamine] Nausea And Vomiting   Ultram [Tramadol] Nausea And Vomiting    Current Outpatient Medications on File Prior to Visit  Medication Sig Dispense Refill   acyclovir ointment (ZOVIRAX) 5 % APPLY 1 APPLICATION TOPICALLY EVERY 3 HOURS. 30 g 2   buprenorphine-naloxone (SUBOXONE) 8-2 mg SUBL SL tablet Place 1 tablet under the tongue daily as needed.     lisinopril-hydrochlorothiazide (ZESTORETIC) 20-12.5 MG tablet Take 1 tablet by mouth daily. for blood pressure. 30 tablet 0   ondansetron (ZOFRAN  ODT) 4 MG disintegrating tablet Take 1 tablet (4 mg total) by mouth every 8 (eight) hours as needed for nausea or vomiting. 15 tablet 0   No current facility-administered medications on file prior to visit.    BP 138/82   Pulse 64   Temp 98 F (36.7 C) (Temporal)   Wt 179 lb (81.2 kg)   SpO2 96%   BMI 30.73 kg/m  Objective:   Physical Exam Cardiovascular:     Rate and Rhythm: Normal rate and regular rhythm.  Pulmonary:     Effort: Pulmonary effort is normal.     Breath sounds: Normal breath sounds.  Musculoskeletal:     Cervical back: Neck supple.  Skin:    General: Skin is warm and dry.           Assessment & Plan:  Essential hypertension Assessment & Plan: Improved, home readings are at goal.  Continue lisinopril-HCTZ 20-12.5 mg daily. CMP pending today.  Will send refills once CMP returns.  Orders: -     Comprehensive metabolic panel  HSV (herpes simplex virus) infection Assessment & Plan: Acute on chronic flare. Refills provided for Valtrex.  Discussed that during flares she is to take 500 mg BID x 3 days. Okay to start daily 500 mg for prevention PRN during stressful times.    Orders: -     valACYclovir HCl; Take 1 tablet by mouth twice daily for 3 days, then take once daily as needed for prevention.  Dispense: 90 tablet; Refill: 0  Encounter for monitoring Suboxone maintenance therapy -     Ambulatory referral to Pain Clinic  Facet hypertrophy of lumbar region Assessment & Plan: After further chart review, it appears that pain management rejected our referral as they do not managed Suboxone. We were not notified.  New referral placed. Discussed with patient today.  Orders: -     Ambulatory referral to [place], NP

## 2022-07-23 NOTE — Assessment & Plan Note (Signed)
Improved, home readings are at goal.  Continue lisinopril-HCTZ 20-12.5 mg daily. CMP pending today.  Will send refills once CMP returns.

## 2022-07-23 NOTE — Assessment & Plan Note (Signed)
Acute on chronic flare. Refills provided for Valtrex.  Discussed that during flares she is to take 500 mg BID x 3 days. Okay to start daily 500 mg for prevention PRN during stressful times.

## 2022-07-24 ENCOUNTER — Other Ambulatory Visit: Payer: Self-pay | Admitting: Primary Care

## 2022-07-24 DIAGNOSIS — I10 Essential (primary) hypertension: Secondary | ICD-10-CM

## 2022-07-24 MED ORDER — VALSARTAN 160 MG PO TABS: 160.0000 mg | ORAL_TABLET | Freq: Every day | ORAL | 0 refills | Status: DC

## 2022-08-14 ENCOUNTER — Ambulatory Visit: Payer: Medicare Other | Admitting: Primary Care

## 2022-08-14 ENCOUNTER — Encounter: Payer: Self-pay | Admitting: Primary Care

## 2022-08-14 VITALS — BP 150/88 | HR 85 | Temp 98.1°F | Ht 64.0 in | Wt 177.0 lb

## 2022-08-14 DIAGNOSIS — Z136 Encounter for screening for cardiovascular disorders: Secondary | ICD-10-CM

## 2022-08-14 DIAGNOSIS — I10 Essential (primary) hypertension: Secondary | ICD-10-CM

## 2022-08-14 DIAGNOSIS — Z0000 Encounter for general adult medical examination without abnormal findings: Secondary | ICD-10-CM | POA: Diagnosis not present

## 2022-08-14 DIAGNOSIS — Z23 Encounter for immunization: Secondary | ICD-10-CM

## 2022-08-14 LAB — BASIC METABOLIC PANEL
BUN: 15 mg/dL (ref 6–23)
CO2: 29 mEq/L (ref 19–32)
Calcium: 9.9 mg/dL (ref 8.4–10.5)
Chloride: 103 mEq/L (ref 96–112)
Creatinine, Ser: 0.89 mg/dL (ref 0.40–1.20)
GFR: 68.11 mL/min (ref >60.00)
Glucose, Bld: 108 mg/dL — ABNORMAL HIGH (ref 70–99)
Potassium: 4.6 mEq/L (ref 3.5–5.1)
Sodium: 140 mEq/L (ref 135–145)

## 2022-08-14 LAB — LIPID PANEL
Cholesterol: 226 mg/dL — ABNORMAL HIGH (ref 0–200)
HDL: 99.2 mg/dL (ref >39.00)
LDL Cholesterol: 116 mg/dL — ABNORMAL HIGH (ref 0–99)
NonHDL: 126.91
Total CHOL/HDL Ratio: 2
Triglycerides: 57 mg/dL (ref 0.0–149.0)
VLDL: 11.4 mg/dL (ref 0.0–40.0)

## 2022-08-14 LAB — CBC
HCT: 42.8 % (ref 36.0–46.0)
Hemoglobin: 14.7 g/dL (ref 12.0–15.0)
MCHC: 34.5 g/dL (ref 30.0–36.0)
MCV: 101.6 fl — ABNORMAL HIGH (ref 78.0–100.0)
Platelets: 366 10*3/uL (ref 150.0–400.0)
RBC: 4.21 Mil/uL (ref 3.87–5.11)
RDW: 14.2 % (ref 11.5–15.5)
WBC: 5.9 10*3/uL (ref 4.0–10.5)

## 2022-08-14 NOTE — Assessment & Plan Note (Addendum)
Above goal today with valsartan 160 mg daily, although normal a few days ago. HCTZ caused moderate hyponatremia, will avoid this moving forward.  I have asked that she submit blood pressure readings via MyChart in 2 weeks. BMP pending.

## 2022-08-14 NOTE — Progress Notes (Signed)
Subjective:    Christina [name] is a 66 y.o. female who presents for a Welcome to Medicare exam.   She was last evaluated on 07/23/2022 for follow-up of hypertension.  She was managed on lisinopril-HCTZ 20-12.5 mg with controlled blood pressure readings.  Her CMP returned with hyponatremia and hyperkalemia so her regimen was discontinued and she was initiated on valsartan 160 mg daily.  Since her last visit she has been taking valsartan 160 mg daily.  She is not checking her blood pressure at home. She had her BP checked two days ago which was 130's/?.   Immunizations: -Tetanus: Completed  -Influenza: Completed this season, declines  -Shingles: Never completed  -Pneumonia: Never completed  Eye exam: Completes annually  Dental exam: Completes semi-annually   Mammogram: Completed in March 2023  Colonoscopy: Completed in 2019, due 2024 Dexa: 2023  BP Readings from Last 3 Encounters:  08/14/22 (!) 150/88  07/23/22 138/82  07/10/22 110/80      Review of Systems  She denies chest pain, dizziness, constipation, diarrhea, headaches.          Objective:    Today's Vitals   08/14/22 0905 08/14/22 0923  BP: (!) 158/96 (!) 150/88  Pulse: 85   Temp: 98.1 F (36.7 C)   TempSrc: Temporal   SpO2: 98%   Weight: 177 lb (80.3 kg)   Height: 5' 4"$  (1.626 m)   Body mass index is 30.38 kg/m.  Medications Outpatient Encounter Medications as of 08/14/2022  Medication Sig   acyclovir ointment (ZOVIRAX) 5 % APPLY 1 APPLICATION TOPICALLY EVERY 3 HOURS.   buprenorphine-naloxone (SUBOXONE) 8-2 mg SUBL SL tablet Place 1 tablet under the tongue daily as needed.   ondansetron (ZOFRAN ODT) 4 MG disintegrating tablet Take 1 tablet (4 mg total) by mouth every 8 (eight) hours as needed for nausea or vomiting.   valACYclovir (VALTREX) 500 MG tablet Take 1 tablet by mouth twice daily for 3 days, then take once daily as needed for prevention.   valsartan (DIOVAN) 160 MG tablet Take 1 tablet (160 mg  total) by mouth daily. for blood pressure.   No facility-administered encounter medications on file as of 08/14/2022.     History: Past Medical History:  Diagnosis Date   Acute left-sided low back pain with left-sided sciatica 09/30/2021   Depression    Dry mouth 07/04/2020   Forgetfulness 12/16/2018   GAD (generalized anxiety disorder)    Genital herpes    Nausea vomiting and diarrhea 01/09/2020   Osteoarthritis    Seasonal allergies    Past Surgical History:  Procedure Laterality Date   CARPAL TUNNEL RELEASE Bilateral 05/21/2013   BILATERAL CARPAL TUNNEL SURGERY   CERVICAL FUSION     CESAREAN SECTION  1990   CESAREAN SECTION  1992   VAGINAL HYSTERECTOMY      Family History  Problem Relation Age of Onset   Cancer Mother        brain   Ovarian cancer Mother    Cancer Father        prostate   Breast cancer Maternal Grandmother    Ovarian cancer Maternal Grandmother    Heart Problems Brother    Healthy Daughter    Healthy Daughter    Healthy Son    Healthy Son    Social History   Occupational History   Not on file  Tobacco Use   Smoking status: Never   Smokeless tobacco: Never  Vaping Use   Vaping Use: Never used  Substance and Sexual Activity   Alcohol use: Yes    Comment: socially   Drug use: No   Sexual activity: Not Currently    Birth control/protection: Surgical    Comment: FEB. 1980, 4 PARTNERS.    Tobacco Counseling Counseling given: Not Answered   Immunizations and Health Maintenance Immunization History  Administered Date(s) Administered   PFIZER(Purple Top)SARS-COV-2 Vaccination 09/22/2019, 10/17/2019   There are no preventive care reminders to display for this patient.   Activities of Daily Living    08/14/2022    9:05 AM  In your present state of health, do you have any difficulty performing the following activities:  Hearing? 0  Vision? 1  Difficulty concentrating or making decisions? 0  Walking or climbing stairs? 0  Dressing  or bathing? 0  Doing errands, shopping? 0    Physical Exam  (optional), or other factors deemed appropriate based on the beneficiary's medical and social history and current clinical standards.  Alert and oriented. Heart sounds without murmur. Bowel sounds active. Abdomen non-tender or distended. Lungs clear  Advanced Directives: Has completed. She will work on her Will.       Assessment:    This is a routine wellness examination for this patient .   ECG: NSR with rate of 84. NO PAC or PVC. Questionable BBB. No prior ECG imaging available for comparison   Vision/Hearing screen Hearing Screening   500Hz$  1000Hz$  2000Hz$  4000Hz$   Right ear 40 40 Fail 40  Left ear 40 40 40 40   Vision Screening   Right eye Left eye Both eyes  Without correction 20/30 20/40 20/30 $  With correction       Dietary issues and exercise activities discussed:      Goals   None    Depression Screen    08/14/2022    9:05 AM 07/23/2022   10:45 AM 07/04/2020   11:32 AM 07/26/2018    3:24 PM  PHQ 2/9 Scores  PHQ - 2 Score 0 0 6 3  PHQ- 9 Score   21 17     Fall Risk    08/14/2022    9:05 AM  Fall Risk   Falls in the past year? 0  Number falls in past yr: 0  Injury with Fall? 0  Risk for fall due to : No Fall Risks  Follow up Falls evaluation completed    Cognitive Function:        08/14/2022    9:30 AM  6CIT Screen  What Year? 0 points  What month? 0 points  What time? 0 points  Count back from 20 0 points  Months in reverse 0 points  Repeat phrase 0 points  Total Score 0 points    Patient Care Team: Pleas Koch, NP as PCP - General (Internal Medicine)     Plan:     I have personally reviewed and noted the following in the patient's chart:   Medical and social history Use of alcohol, tobacco or illicit drugs  Current medications and supplements Functional ability and status Nutritional status Physical activity Advanced directives List of other  physicians Hospitalizations, surgeries, and ER visits in previous 12 months Vitals Screenings to include cognitive, depression, and falls Referrals and appointments  In addition, I have reviewed and discussed with patient certain preventive protocols, quality metrics, and best practice recommendations. A written personalized care plan for preventive services as well as general preventive health recommendations were provided to patient.     Leticia Penna  Carlis Abbott, NP 08/14/2022

## 2022-08-14 NOTE — Assessment & Plan Note (Signed)
Discussed Shingrix vaccines.  Prevnar 20 completed today. Declines influenza vaccine today.  Colonoscopy due, she will call [facility] to inquire.  Mammogram scheduled.   I have personally reviewed and noted the following in the patient's chart:   Medical and social history Use of alcohol, tobacco or illicit drugs  Current medications and supplements Functional ability and status Nutritional status Physical activity Advanced directives List of other physicians Hospitalizations, surgeries, and ER visits in previous 12 months Vitals Screenings to include cognitive, depression, and falls Referrals and appointments  In addition, I have reviewed and discussed with patient certain preventive protocols, quality metrics, and best practice recommendations. A written personalized care plan for preventive services as well as general preventive health recommendations were provided to patient.

## 2022-08-14 NOTE — Addendum Note (Signed)
Addended by: Pat Kocher on: 08/14/2022 09:58 AM   Modules accepted: Orders

## 2022-08-14 NOTE — Patient Instructions (Addendum)
Stop by the lab prior to leaving today. I will notify you of your results once received.   You are due for your colonoscopy this year.  Complete your mammogram as scheduled.  Consider the Shingles vaccines.   Send me blood pressure readings via MyChart in 2 weeks.  It was a pleasure to see you today!

## 2022-08-18 DIAGNOSIS — B009 Herpesviral infection, unspecified: Secondary | ICD-10-CM

## 2022-08-18 DIAGNOSIS — I10 Essential (primary) hypertension: Secondary | ICD-10-CM

## 2022-08-18 MED ORDER — VALSARTAN 320 MG PO TABS: 320.0000 mg | ORAL_TABLET | Freq: Every day | ORAL | 0 refills | Status: DC

## 2022-08-22 ENCOUNTER — Other Ambulatory Visit: Payer: Self-pay | Admitting: Primary Care

## 2022-08-22 DIAGNOSIS — B009 Herpesviral infection, unspecified: Secondary | ICD-10-CM

## 2022-09-16 ENCOUNTER — Other Ambulatory Visit: Payer: Self-pay | Admitting: Primary Care

## 2022-09-16 DIAGNOSIS — I10 Essential (primary) hypertension: Secondary | ICD-10-CM

## 2022-09-16 MED ORDER — VALSARTAN 320 MG PO TABS: 320.0000 mg | ORAL_TABLET | Freq: Every day | ORAL | 2 refills | Status: DC

## 2022-09-25 ENCOUNTER — Encounter: Payer: Self-pay | Admitting: Nurse Practitioner

## 2022-09-30 ENCOUNTER — Other Ambulatory Visit: Payer: Self-pay | Admitting: Primary Care

## 2022-09-30 DIAGNOSIS — B009 Herpesviral infection, unspecified: Secondary | ICD-10-CM

## 2022-10-01 ENCOUNTER — Telehealth: Payer: Self-pay

## 2022-10-01 MED ORDER — VALACYCLOVIR HCL 500 MG PO TABS: 500.0000 mg | ORAL_TABLET | Freq: Every day | ORAL | 0 refills | Status: DC

## 2022-10-01 NOTE — Telephone Encounter (Signed)
Pt called stating that she just scheduled her AEX w/ [facility] for 10/28/2022. However, needs refill on her valtrex today.  Her chart indicated that her PCP sent her a 3 month supply as of today. Pt advised and she voiced understanding. Will close encounter.

## 2022-10-28 ENCOUNTER — Ambulatory Visit: Payer: Managed Care, Other (non HMO) | Admitting: Nurse Practitioner

## 2022-12-22 ENCOUNTER — Other Ambulatory Visit: Payer: Self-pay | Admitting: Primary Care

## 2022-12-22 DIAGNOSIS — B009 Herpesviral infection, unspecified: Secondary | ICD-10-CM

## 2022-12-22 MED ORDER — VALACYCLOVIR HCL 1 G PO TABS
1000.0000 mg | ORAL_TABLET | Freq: Every day | ORAL | 0 refills | Status: DC
Start: 2022-12-22 — End: 2023-04-15

## 2022-12-22 NOTE — Telephone Encounter (Signed)
From: Alease Frame To: Office of Doreene Nest, NP Sent: 12/22/2022 2:48 PM EDT Subject: Medication Renewal Request  Refills have been requested for the following medications:   valACYclovir (VALTREX) 500 MG tablet [[facility] [place] [facility] [place] [facility]]  Patient Comment: I am taking them daily during this divorce. I am having flare ups that I take three, then two then one a day until it flares back up. I am having quite a few flare up with this stress. I have about 6 left in the bottle so I don't want to run out. Thanks   Preferred pharmacy: [facility] [address] [facility] - [place], [place] - [address] AT [facility] [address] Delivery method: Daryll Drown

## 2022-12-31 DIAGNOSIS — R030 Elevated blood-pressure reading, without diagnosis of hypertension: Secondary | ICD-10-CM | POA: Diagnosis not present

## 2022-12-31 DIAGNOSIS — Z79899 Other long term (current) drug therapy: Secondary | ICD-10-CM | POA: Diagnosis not present

## 2022-12-31 DIAGNOSIS — F1120 Opioid dependence, uncomplicated: Secondary | ICD-10-CM | POA: Diagnosis not present

## 2022-12-31 DIAGNOSIS — Z6829 Body mass index (BMI) 29.0-29.9, adult: Secondary | ICD-10-CM | POA: Diagnosis not present

## 2023-01-06 DIAGNOSIS — Z79899 Other long term (current) drug therapy: Secondary | ICD-10-CM | POA: Diagnosis not present

## 2023-01-14 ENCOUNTER — Ambulatory Visit: Payer: Managed Care, Other (non HMO) | Admitting: Nurse Practitioner

## 2023-01-14 NOTE — Progress Notes (Deleted)
   Christina [name] Sep 22, 1955 [id number]   History:  67 y.o. [id number] presents for annual exam. Postmenopausal - No HRT. S/P 2006 TVH for DUB. Abnormal pap without intervention years ago, normal since. HSV on buttocks. HTN, Vitamin B12 deficiency, anxiety/depression managed by PCP.   Gynecologic History No LMP recorded. Patient has had a hysterectomy.   Contraception/Family planning: status post hysterectomy Sexually active: Yes  Health Maintenance Last Pap: 03/25/2013. Results were: Normal Last mammogram: 09/25/2022. Results were: Normal Last colonoscopy: 12/09/2017. Results were: benign polyp, 5-year recall Last Dexa: 07/30/2021. Results were: T-score -2.5 (left femoral neck)  Past medical history, past surgical history, family history and social history were all reviewed and documented in the EPIC chart. Married. 1 son, 3 daughters, all live local. 4 grandsons ages 56-8. 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   ROS:  A ROS was performed and pertinent positives and negatives are included.  Exam:  There were no vitals filed for this visit.  There is no height or weight on file to calculate BMI.  General appearance:  Normal Thyroid:  Symmetrical, normal in size, without palpable masses or nodularity. Respiratory  Auscultation:  Clear without wheezing or rhonchi Cardiovascular  Auscultation:  Regular rate, without rubs, murmurs or gallops  Edema/varicosities:  Not grossly evident Abdominal  Soft,nontender, without masses, guarding or rebound.  Liver/spleen:  No organomegaly noted  Hernia:  None appreciated  Skin  Inspection:  Grossly normal Breasts: Examined lying and sitting.   Right: Without masses, retractions, nipple discharge or axillary adenopathy.   Left: Without masses, retractions, nipple discharge or axillary adenopathy. Genitourinary   Inguinal/mons:  Normal without inguinal adenopathy  External genitalia:  Normal appearing vulva with no masses, tenderness, or  lesions  BUS/Urethra/Skene's glands:  Normal  Vagina:  Normal appearing with normal color and discharge, no lesions  Cervix:  Absent  Uterus:  Absent  Adnexa/parametria:     Rt: Normal in size, without masses or tenderness.   Lt: Normal in size, without masses or tenderness.  Anus and perineum: Normal  Digital rectal exam: Not indicated  Patient informed chaperone available to be present for breast and pelvic exam. Patient has requested no chaperone to be present. Patient has been advised what will be completed during breast and pelvic exam.   Assessment/Plan:  67 y.o. [id number] for annual exam.   Well female exam with routine gynecological exam - Education provided on SBEs, importance of preventative screenings, current guidelines, high calcium diet, regular exercise, and multivitamin daily. Labs with PCP.   Postmenopausal - No HRT. S/P 2006 TVH for DUB.   Osteopenia of multiple sites - Plan: DG Bone Density. T-score -2.2 without elevated FRAX in 2016. Will repeat DXA now. Continue Vitamin D supplement. Add in Calcium supplement or multivitamin and increase exercise. She plans to rejoin gym soon.   HSV (herpes simplex virus) infection - Plan: valACYclovir (VALTREX) 500 MG tablet daily. Has been having more outbreaks. Recommend taking daily.   Screening for cervical cancer - Abnormal pap without intervention years ago, normal since. No longer screening per guidelines.   Screening for breast cancer - Normal mammogram history.  Continue annual screenings.  Normal breast exam today.  Screening for colon cancer - 2019 colonoscopy - benign polyps. Will repeat at 5-year interval per GI's recommendation.   Return in 1 year for annual.     Olivia Mackie DNP, 11:36 AM 01/14/2023

## 2023-01-29 DIAGNOSIS — M545 Low back pain, unspecified: Secondary | ICD-10-CM | POA: Diagnosis not present

## 2023-01-29 DIAGNOSIS — R030 Elevated blood-pressure reading, without diagnosis of hypertension: Secondary | ICD-10-CM | POA: Diagnosis not present

## 2023-01-29 DIAGNOSIS — Z6829 Body mass index (BMI) 29.0-29.9, adult: Secondary | ICD-10-CM | POA: Diagnosis not present

## 2023-01-29 DIAGNOSIS — G8929 Other chronic pain: Secondary | ICD-10-CM | POA: Diagnosis not present

## 2023-01-29 DIAGNOSIS — F1120 Opioid dependence, uncomplicated: Secondary | ICD-10-CM | POA: Diagnosis not present

## 2023-01-29 DIAGNOSIS — Z79899 Other long term (current) drug therapy: Secondary | ICD-10-CM | POA: Diagnosis not present

## 2023-02-02 DIAGNOSIS — Z79899 Other long term (current) drug therapy: Secondary | ICD-10-CM | POA: Diagnosis not present

## 2023-02-27 DIAGNOSIS — Z79899 Other long term (current) drug therapy: Secondary | ICD-10-CM | POA: Diagnosis not present

## 2023-02-27 DIAGNOSIS — I10 Essential (primary) hypertension: Secondary | ICD-10-CM | POA: Diagnosis not present

## 2023-02-27 DIAGNOSIS — G8929 Other chronic pain: Secondary | ICD-10-CM | POA: Diagnosis not present

## 2023-02-27 DIAGNOSIS — M545 Low back pain, unspecified: Secondary | ICD-10-CM | POA: Diagnosis not present

## 2023-02-27 DIAGNOSIS — F1120 Opioid dependence, uncomplicated: Secondary | ICD-10-CM | POA: Diagnosis not present

## 2023-03-05 DIAGNOSIS — Z79899 Other long term (current) drug therapy: Secondary | ICD-10-CM | POA: Diagnosis not present

## 2023-03-27 DIAGNOSIS — M5136 Other intervertebral disc degeneration, lumbar region: Secondary | ICD-10-CM | POA: Diagnosis not present

## 2023-03-27 DIAGNOSIS — I10 Essential (primary) hypertension: Secondary | ICD-10-CM | POA: Diagnosis not present

## 2023-03-27 DIAGNOSIS — Z79899 Other long term (current) drug therapy: Secondary | ICD-10-CM | POA: Diagnosis not present

## 2023-03-27 DIAGNOSIS — F1120 Opioid dependence, uncomplicated: Secondary | ICD-10-CM | POA: Diagnosis not present

## 2023-03-31 DIAGNOSIS — Z79899 Other long term (current) drug therapy: Secondary | ICD-10-CM | POA: Diagnosis not present

## 2023-04-10 DIAGNOSIS — H52209 Unspecified astigmatism, unspecified eye: Secondary | ICD-10-CM | POA: Diagnosis not present

## 2023-04-10 DIAGNOSIS — H5213 Myopia, bilateral: Secondary | ICD-10-CM | POA: Diagnosis not present

## 2023-04-10 DIAGNOSIS — H524 Presbyopia: Secondary | ICD-10-CM | POA: Diagnosis not present

## 2023-04-15 ENCOUNTER — Other Ambulatory Visit: Payer: Self-pay | Admitting: Primary Care

## 2023-04-15 DIAGNOSIS — B009 Herpesviral infection, unspecified: Secondary | ICD-10-CM

## 2023-04-15 NOTE — Telephone Encounter (Signed)
Please call patient:  Received refill request for Valtrex medication.  Is she taking the Valtrex as needed or every day now?  Also, needs follow-up visit scheduled for late January 2025.

## 2023-04-16 DIAGNOSIS — C44319 Basal cell carcinoma of skin of other parts of face: Secondary | ICD-10-CM | POA: Diagnosis not present

## 2023-04-16 DIAGNOSIS — L570 Actinic keratosis: Secondary | ICD-10-CM | POA: Diagnosis not present

## 2023-04-16 DIAGNOSIS — X32XXXA Exposure to sunlight, initial encounter: Secondary | ICD-10-CM | POA: Diagnosis not present

## 2023-04-16 DIAGNOSIS — D485 Neoplasm of uncertain behavior of skin: Secondary | ICD-10-CM | POA: Diagnosis not present

## 2023-04-24 DIAGNOSIS — Z79899 Other long term (current) drug therapy: Secondary | ICD-10-CM | POA: Diagnosis not present

## 2023-04-24 DIAGNOSIS — F1120 Opioid dependence, uncomplicated: Secondary | ICD-10-CM | POA: Diagnosis not present

## 2023-04-29 DIAGNOSIS — Z79899 Other long term (current) drug therapy: Secondary | ICD-10-CM | POA: Diagnosis not present

## 2023-05-22 DIAGNOSIS — F1120 Opioid dependence, uncomplicated: Secondary | ICD-10-CM | POA: Diagnosis not present

## 2023-05-22 DIAGNOSIS — Z79899 Other long term (current) drug therapy: Secondary | ICD-10-CM | POA: Diagnosis not present

## 2023-05-22 DIAGNOSIS — M51369 Other intervertebral disc degeneration, lumbar region without mention of lumbar back pain or lower extremity pain: Secondary | ICD-10-CM | POA: Diagnosis not present

## 2023-05-26 DIAGNOSIS — Z79899 Other long term (current) drug therapy: Secondary | ICD-10-CM | POA: Diagnosis not present

## 2023-06-18 DIAGNOSIS — Z79899 Other long term (current) drug therapy: Secondary | ICD-10-CM | POA: Diagnosis not present

## 2023-06-18 DIAGNOSIS — M51369 Other intervertebral disc degeneration, lumbar region without mention of lumbar back pain or lower extremity pain: Secondary | ICD-10-CM | POA: Diagnosis not present

## 2023-06-18 DIAGNOSIS — Z6830 Body mass index (BMI) 30.0-30.9, adult: Secondary | ICD-10-CM | POA: Diagnosis not present

## 2023-06-18 DIAGNOSIS — R030 Elevated blood-pressure reading, without diagnosis of hypertension: Secondary | ICD-10-CM | POA: Diagnosis not present

## 2023-06-18 DIAGNOSIS — E6609 Other obesity due to excess calories: Secondary | ICD-10-CM | POA: Diagnosis not present

## 2023-06-18 DIAGNOSIS — F1120 Opioid dependence, uncomplicated: Secondary | ICD-10-CM | POA: Diagnosis not present

## 2023-06-22 DIAGNOSIS — Z79899 Other long term (current) drug therapy: Secondary | ICD-10-CM | POA: Diagnosis not present

## 2023-07-16 ENCOUNTER — Other Ambulatory Visit: Payer: Self-pay | Admitting: Primary Care

## 2023-07-16 DIAGNOSIS — I10 Essential (primary) hypertension: Secondary | ICD-10-CM

## 2023-07-16 NOTE — Telephone Encounter (Signed)
Patient is due for follow up in late February, this will be required prior to any further refills.  Please schedule, thank you!

## 2023-07-17 ENCOUNTER — Telehealth: Payer: Self-pay | Admitting: Primary Care

## 2023-07-17 DIAGNOSIS — R0602 Shortness of breath: Secondary | ICD-10-CM | POA: Diagnosis not present

## 2023-07-17 DIAGNOSIS — R5383 Other fatigue: Secondary | ICD-10-CM | POA: Diagnosis not present

## 2023-07-17 DIAGNOSIS — I10 Essential (primary) hypertension: Secondary | ICD-10-CM | POA: Diagnosis not present

## 2023-07-17 DIAGNOSIS — A6009 Herpesviral infection of other urogenital tract: Secondary | ICD-10-CM | POA: Diagnosis not present

## 2023-07-17 DIAGNOSIS — E7800 Pure hypercholesterolemia, unspecified: Secondary | ICD-10-CM | POA: Diagnosis not present

## 2023-07-17 DIAGNOSIS — Z113 Encounter for screening for infections with a predominantly sexual mode of transmission: Secondary | ICD-10-CM | POA: Diagnosis not present

## 2023-07-17 DIAGNOSIS — R110 Nausea: Secondary | ICD-10-CM | POA: Diagnosis not present

## 2023-07-17 DIAGNOSIS — F1120 Opioid dependence, uncomplicated: Secondary | ICD-10-CM | POA: Diagnosis not present

## 2023-07-17 DIAGNOSIS — E559 Vitamin D deficiency, unspecified: Secondary | ICD-10-CM | POA: Diagnosis not present

## 2023-07-17 DIAGNOSIS — Z79899 Other long term (current) drug therapy: Secondary | ICD-10-CM | POA: Diagnosis not present

## 2023-07-17 DIAGNOSIS — Z0000 Encounter for general adult medical examination without abnormal findings: Secondary | ICD-10-CM | POA: Diagnosis not present

## 2023-07-17 DIAGNOSIS — Z780 Asymptomatic menopausal state: Secondary | ICD-10-CM | POA: Diagnosis not present

## 2023-07-17 DIAGNOSIS — Z131 Encounter for screening for diabetes mellitus: Secondary | ICD-10-CM | POA: Diagnosis not present

## 2023-07-17 NOTE — Telephone Encounter (Signed)
PATIENT IS CALLING BACK STATING THAT SHE IS GETTING HER ANNUAL WELLNESS DONE TODAY AT [facility] SHE STATED THAT SHE WILL ALSO BE GETTING ALL HER MEDS THROUGH [facility] AS WELL

## 2023-07-17 NOTE — Telephone Encounter (Signed)
Spoke with pt and she states that she is switching her care to [facility]. PCP has been updated in chart.

## 2023-07-17 NOTE — Telephone Encounter (Signed)
lvm for pt to call office to schedule

## 2023-07-21 DIAGNOSIS — Z79899 Other long term (current) drug therapy: Secondary | ICD-10-CM | POA: Diagnosis not present

## 2023-08-14 DIAGNOSIS — E559 Vitamin D deficiency, unspecified: Secondary | ICD-10-CM | POA: Diagnosis not present

## 2023-08-14 DIAGNOSIS — Z79899 Other long term (current) drug therapy: Secondary | ICD-10-CM | POA: Diagnosis not present

## 2023-08-14 DIAGNOSIS — F1120 Opioid dependence, uncomplicated: Secondary | ICD-10-CM | POA: Diagnosis not present

## 2023-08-14 DIAGNOSIS — I10 Essential (primary) hypertension: Secondary | ICD-10-CM | POA: Diagnosis not present

## 2023-08-14 DIAGNOSIS — E7800 Pure hypercholesterolemia, unspecified: Secondary | ICD-10-CM | POA: Diagnosis not present

## 2023-08-19 DIAGNOSIS — Z79899 Other long term (current) drug therapy: Secondary | ICD-10-CM | POA: Diagnosis not present

## 2023-09-11 DIAGNOSIS — Z79899 Other long term (current) drug therapy: Secondary | ICD-10-CM | POA: Diagnosis not present

## 2023-09-11 DIAGNOSIS — G8929 Other chronic pain: Secondary | ICD-10-CM | POA: Diagnosis not present

## 2023-09-11 DIAGNOSIS — R0602 Shortness of breath: Secondary | ICD-10-CM | POA: Diagnosis not present

## 2023-09-11 DIAGNOSIS — F1120 Opioid dependence, uncomplicated: Secondary | ICD-10-CM | POA: Diagnosis not present

## 2023-09-16 DIAGNOSIS — Z79899 Other long term (current) drug therapy: Secondary | ICD-10-CM | POA: Diagnosis not present

## 2023-09-29 DIAGNOSIS — Z1231 Encounter for screening mammogram for malignant neoplasm of breast: Secondary | ICD-10-CM | POA: Diagnosis not present

## 2023-10-08 DIAGNOSIS — Z79899 Other long term (current) drug therapy: Secondary | ICD-10-CM | POA: Diagnosis not present

## 2023-10-08 DIAGNOSIS — R059 Cough, unspecified: Secondary | ICD-10-CM | POA: Diagnosis not present

## 2023-10-12 DIAGNOSIS — R062 Wheezing: Secondary | ICD-10-CM | POA: Diagnosis not present

## 2023-10-12 DIAGNOSIS — R059 Cough, unspecified: Secondary | ICD-10-CM | POA: Diagnosis not present

## 2023-10-12 DIAGNOSIS — J209 Acute bronchitis, unspecified: Secondary | ICD-10-CM | POA: Diagnosis not present

## 2023-10-13 DIAGNOSIS — Z79899 Other long term (current) drug therapy: Secondary | ICD-10-CM | POA: Diagnosis not present

## 2023-11-05 DIAGNOSIS — E559 Vitamin D deficiency, unspecified: Secondary | ICD-10-CM | POA: Diagnosis not present

## 2023-11-05 DIAGNOSIS — E538 Deficiency of other specified B group vitamins: Secondary | ICD-10-CM | POA: Diagnosis not present

## 2023-11-05 DIAGNOSIS — Z79899 Other long term (current) drug therapy: Secondary | ICD-10-CM | POA: Diagnosis not present

## 2023-11-05 DIAGNOSIS — I10 Essential (primary) hypertension: Secondary | ICD-10-CM | POA: Diagnosis not present

## 2023-11-05 DIAGNOSIS — E7800 Pure hypercholesterolemia, unspecified: Secondary | ICD-10-CM | POA: Diagnosis not present

## 2023-11-05 DIAGNOSIS — I5189 Other ill-defined heart diseases: Secondary | ICD-10-CM | POA: Diagnosis not present

## 2023-11-05 DIAGNOSIS — Z780 Asymptomatic menopausal state: Secondary | ICD-10-CM | POA: Diagnosis not present

## 2023-11-05 DIAGNOSIS — F1120 Opioid dependence, uncomplicated: Secondary | ICD-10-CM | POA: Diagnosis not present

## 2023-11-13 DIAGNOSIS — Z780 Asymptomatic menopausal state: Secondary | ICD-10-CM | POA: Diagnosis not present

## 2023-12-03 DIAGNOSIS — M51369 Other intervertebral disc degeneration, lumbar region without mention of lumbar back pain or lower extremity pain: Secondary | ICD-10-CM | POA: Diagnosis not present

## 2023-12-03 DIAGNOSIS — M8589 Other specified disorders of bone density and structure, multiple sites: Secondary | ICD-10-CM | POA: Diagnosis not present

## 2023-12-03 DIAGNOSIS — Z79899 Other long term (current) drug therapy: Secondary | ICD-10-CM | POA: Diagnosis not present

## 2023-12-03 DIAGNOSIS — F1120 Opioid dependence, uncomplicated: Secondary | ICD-10-CM | POA: Diagnosis not present

## 2023-12-03 DIAGNOSIS — I5189 Other ill-defined heart diseases: Secondary | ICD-10-CM | POA: Diagnosis not present

## 2023-12-03 DIAGNOSIS — I10 Essential (primary) hypertension: Secondary | ICD-10-CM | POA: Diagnosis not present

## 2023-12-30 DIAGNOSIS — Z79899 Other long term (current) drug therapy: Secondary | ICD-10-CM | POA: Diagnosis not present

## 2023-12-30 DIAGNOSIS — F1120 Opioid dependence, uncomplicated: Secondary | ICD-10-CM | POA: Diagnosis not present

## 2023-12-30 DIAGNOSIS — I10 Essential (primary) hypertension: Secondary | ICD-10-CM | POA: Diagnosis not present

## 2024-01-06 DIAGNOSIS — Z79899 Other long term (current) drug therapy: Secondary | ICD-10-CM | POA: Diagnosis not present

## 2024-01-29 DIAGNOSIS — Z79899 Other long term (current) drug therapy: Secondary | ICD-10-CM | POA: Diagnosis not present

## 2024-01-29 DIAGNOSIS — F1120 Opioid dependence, uncomplicated: Secondary | ICD-10-CM | POA: Diagnosis not present

## 2024-01-29 DIAGNOSIS — I10 Essential (primary) hypertension: Secondary | ICD-10-CM | POA: Diagnosis not present

## 2024-02-26 DIAGNOSIS — E559 Vitamin D deficiency, unspecified: Secondary | ICD-10-CM | POA: Diagnosis not present

## 2024-02-26 DIAGNOSIS — I10 Essential (primary) hypertension: Secondary | ICD-10-CM | POA: Diagnosis not present

## 2024-02-26 DIAGNOSIS — F1121 Opioid dependence, in remission: Secondary | ICD-10-CM | POA: Diagnosis not present

## 2024-02-26 DIAGNOSIS — Z79899 Other long term (current) drug therapy: Secondary | ICD-10-CM | POA: Diagnosis not present

## 2024-02-26 DIAGNOSIS — B009 Herpesviral infection, unspecified: Secondary | ICD-10-CM | POA: Diagnosis not present

## 2024-03-28 DIAGNOSIS — E663 Overweight: Secondary | ICD-10-CM | POA: Diagnosis not present

## 2024-03-28 DIAGNOSIS — Z6829 Body mass index (BMI) 29.0-29.9, adult: Secondary | ICD-10-CM | POA: Diagnosis not present

## 2024-03-28 DIAGNOSIS — F1121 Opioid dependence, in remission: Secondary | ICD-10-CM | POA: Diagnosis not present

## 2024-03-28 DIAGNOSIS — Z79899 Other long term (current) drug therapy: Secondary | ICD-10-CM | POA: Diagnosis not present

## 2024-03-31 DIAGNOSIS — Z79899 Other long term (current) drug therapy: Secondary | ICD-10-CM | POA: Diagnosis not present

## 2024-04-27 DIAGNOSIS — E559 Vitamin D deficiency, unspecified: Secondary | ICD-10-CM | POA: Diagnosis not present

## 2024-04-27 DIAGNOSIS — R030 Elevated blood-pressure reading, without diagnosis of hypertension: Secondary | ICD-10-CM | POA: Diagnosis not present

## 2024-04-27 DIAGNOSIS — Z6830 Body mass index (BMI) 30.0-30.9, adult: Secondary | ICD-10-CM | POA: Diagnosis not present

## 2024-04-27 DIAGNOSIS — Z79899 Other long term (current) drug therapy: Secondary | ICD-10-CM | POA: Diagnosis not present

## 2024-04-27 DIAGNOSIS — E7800 Pure hypercholesterolemia, unspecified: Secondary | ICD-10-CM | POA: Diagnosis not present

## 2024-04-27 DIAGNOSIS — F1121 Opioid dependence, in remission: Secondary | ICD-10-CM | POA: Diagnosis not present
